# Patient Record
Sex: Female | Born: 1970 | Race: Black or African American | Hispanic: No | Smoking: Current every day smoker
Health system: Southern US, Community
[De-identification: ages and names within clinical notes are randomized; demographics above are authoritative.]

## PROBLEM LIST (undated history)

## (undated) DIAGNOSIS — F329 Major depressive disorder, single episode, unspecified: Secondary | ICD-10-CM

## (undated) DIAGNOSIS — F319 Bipolar disorder, unspecified: Secondary | ICD-10-CM

## (undated) DIAGNOSIS — D573 Sickle-cell trait: Secondary | ICD-10-CM

## (undated) DIAGNOSIS — M1990 Unspecified osteoarthritis, unspecified site: Secondary | ICD-10-CM

## (undated) DIAGNOSIS — J42 Unspecified chronic bronchitis: Secondary | ICD-10-CM

## (undated) DIAGNOSIS — I10 Essential (primary) hypertension: Secondary | ICD-10-CM

## (undated) DIAGNOSIS — D649 Anemia, unspecified: Secondary | ICD-10-CM

## (undated) DIAGNOSIS — F32A Depression, unspecified: Secondary | ICD-10-CM

## (undated) DIAGNOSIS — J40 Bronchitis, not specified as acute or chronic: Secondary | ICD-10-CM

## (undated) HISTORY — DX: Bronchitis, not specified as acute or chronic: J40

## (undated) HISTORY — PX: ABDOMINAL HYSTERECTOMY: SHX81

## (undated) HISTORY — PX: APPENDECTOMY: SHX54

---

## 2008-04-12 ENCOUNTER — Emergency Department: Payer: Self-pay | Admitting: Emergency Medicine

## 2008-11-21 ENCOUNTER — Emergency Department: Payer: Self-pay | Admitting: Emergency Medicine

## 2010-09-01 ENCOUNTER — Emergency Department: Payer: Self-pay | Admitting: Unknown Physician Specialty

## 2014-05-09 DIAGNOSIS — Z6841 Body Mass Index (BMI) 40.0 and over, adult: Secondary | ICD-10-CM | POA: Insufficient documentation

## 2014-05-09 DIAGNOSIS — Z8639 Personal history of other endocrine, nutritional and metabolic disease: Secondary | ICD-10-CM | POA: Insufficient documentation

## 2014-05-09 DIAGNOSIS — Z720 Tobacco use: Secondary | ICD-10-CM | POA: Insufficient documentation

## 2015-11-15 DIAGNOSIS — G8929 Other chronic pain: Secondary | ICD-10-CM | POA: Insufficient documentation

## 2015-11-15 DIAGNOSIS — N920 Excessive and frequent menstruation with regular cycle: Secondary | ICD-10-CM | POA: Insufficient documentation

## 2015-11-15 DIAGNOSIS — M25561 Pain in right knee: Secondary | ICD-10-CM

## 2015-11-15 DIAGNOSIS — M25562 Pain in left knee: Secondary | ICD-10-CM

## 2015-11-15 DIAGNOSIS — D252 Subserosal leiomyoma of uterus: Secondary | ICD-10-CM | POA: Insufficient documentation

## 2015-11-15 DIAGNOSIS — I10 Essential (primary) hypertension: Secondary | ICD-10-CM | POA: Insufficient documentation

## 2015-11-15 DIAGNOSIS — J309 Allergic rhinitis, unspecified: Secondary | ICD-10-CM | POA: Insufficient documentation

## 2015-11-27 DIAGNOSIS — M1712 Unilateral primary osteoarthritis, left knee: Secondary | ICD-10-CM | POA: Insufficient documentation

## 2016-09-11 DIAGNOSIS — Z720 Tobacco use: Secondary | ICD-10-CM | POA: Insufficient documentation

## 2016-09-11 DIAGNOSIS — K439 Ventral hernia without obstruction or gangrene: Secondary | ICD-10-CM | POA: Insufficient documentation

## 2016-11-07 ENCOUNTER — Emergency Department
Admission: EM | Admit: 2016-11-07 | Discharge: 2016-11-07 | Disposition: A | Discharge status: Home / Self Care | Payer: Self-pay | Attending: Emergency Medicine | Admitting: Emergency Medicine

## 2016-11-07 ENCOUNTER — Encounter: Payer: Self-pay | Admitting: Emergency Medicine

## 2016-11-07 ENCOUNTER — Emergency Department: Payer: Self-pay

## 2016-11-07 DIAGNOSIS — I10 Essential (primary) hypertension: Secondary | ICD-10-CM | POA: Insufficient documentation

## 2016-11-07 DIAGNOSIS — M5410 Radiculopathy, site unspecified: Secondary | ICD-10-CM | POA: Insufficient documentation

## 2016-11-07 DIAGNOSIS — F17210 Nicotine dependence, cigarettes, uncomplicated: Secondary | ICD-10-CM | POA: Insufficient documentation

## 2016-11-07 HISTORY — DX: Unspecified osteoarthritis, unspecified site: M19.90

## 2016-11-07 HISTORY — DX: Essential (primary) hypertension: I10

## 2016-11-07 MED ORDER — OXYCODONE-ACETAMINOPHEN 5-325 MG PO TABS
1.0000 | ORAL_TABLET | Freq: Once | ORAL | Status: AC
  Administered 2016-11-07: 1 via ORAL
  Filled 2016-11-07: qty 1

## 2016-11-07 MED ORDER — METHYLPREDNISOLONE SODIUM SUCC 125 MG IJ SOLR
125.0000 mg | Freq: Once | INTRAMUSCULAR | Status: AC
  Administered 2016-11-07: 125 mg via INTRAMUSCULAR
  Filled 2016-11-07: qty 2

## 2016-11-07 MED ORDER — CYCLOBENZAPRINE HCL 5 MG PO TABS: 5.0000 mg | ORAL_TABLET | Freq: Three times a day (TID) | ORAL | 0 refills | Status: AC | PRN

## 2016-11-07 MED ORDER — PREDNISONE 10 MG (21) PO TBPK: ORAL_TABLET | ORAL | 0 refills | Status: DC

## 2016-11-07 NOTE — ED Triage Notes (Signed)
States L shoulder pain x 1 month. Originally gave her pain with movement, now hurts all the time and increases with movement. Denies injury or fall at onset.

## 2016-11-07 NOTE — ED Provider Notes (Signed)
[facility] Emergency Department Provider Note  ____________________________________________  Time seen: Approximately 12:36 PM  I have reviewed the triage vital signs and the nursing notes.   HISTORY  Chief Complaint Shoulder Pain    HPI Dawn Alvarado is a 46 y.o. female that presents to the emergency department with left arm numbness and tingling for one month.Patient states that moving the shoulder makes the pain worse. She states that it is painful when she presses on her shoulder and on her upper back. Cold and wind make the pain worse. Pain is sharp and shooting in character. Her fingers tingle occasionally go numb. No trauma. She has taken Tylenol for pain. She came to the emergency department today because she had trouble sleeping last night due to the pain. She is not diabetic. She denies headache, visual changes, neck pain, shortness of breath, chest pain, palpitations, nausea, vomiting, abdominal pain.   Past Medical History:  Diagnosis Date  . Arthritis   . Hypertension     There are no active problems to display for this patient.   Past Surgical History:  Procedure Laterality Date  . APPENDECTOMY    . CESAREAN SECTION      Prior to Admission medications   Medication Sig Start Date End Date Taking? Authorizing Provider  cyclobenzaprine (FLEXERIL) 5 MG tablet Take 1 tablet (5 mg total) by mouth 3 (three) times daily as needed for muscle spasms. 11/07/16 11/14/16  Laban Emperor, PA-C  predniSONE (STERAPRED UNI-PAK 21 TAB) 10 MG (21) TBPK tablet Take 6 tablets on day 1, take 5 tablets on day 2, take 4 tablets on day 3, take 3 tablets on day 4, take 2 tablets on day 5, take 1 tablet on day 6 11/07/16   Laban Emperor, PA-C    Allergies Patient has no known allergies.  No family history on file.  Social History Social History  Substance Use Topics  . Smoking status: Current Every Day Smoker    Packs/day: 0.50    Types: Cigarettes  .  Smokeless tobacco: Not on file  . Alcohol use Not on file     Review of Systems  Constitutional: No fever/chills ENT: No upper respiratory complaints. Cardiovascular: No chest pain. Respiratory: No SOB. Gastrointestinal: No abdominal pain.  No nausea, no vomiting.  Skin: Negative for rash, abrasions, lacerations, ecchymosis. Neurological: Negative for headaches   ____________________________________________   PHYSICAL EXAM:  VITAL SIGNS: ED Triage Vitals  Enc Vitals Group     BP 11/07/16 1109 (!) 152/98     Pulse Rate 11/07/16 1109 90     Resp 11/07/16 1109 18     Temp 11/07/16 1109 98.3 F (36.8 C)     Temp Source 11/07/16 1109 Oral     SpO2 11/07/16 1109 99 %     Weight 11/07/16 1110 293 lb (132.9 kg)     Height 11/07/16 1110 5\' 10"  (1.778 m)     Head Circumference --      Peak Flow --      Pain Score 11/07/16 1109 10     Pain Loc --      Pain Edu? --      Excl. in [place]? --      Constitutional: Alert and oriented. Well appearing and in no acute distress. Eyes: Conjunctivae are normal. PERRL. EOMI. Head: Atraumatic. ENT:      Ears:      Nose: No congestion/rhinnorhea.      Mouth/Throat: Mucous membranes are moist.  Neck: No  stridor.  No cervical spine tenderness to palpation. No radiculopathy with axial loading. Cardiovascular: Normal rate, regular rhythm.  Good peripheral circulation. Respiratory: Normal respiratory effort without tachypnea or retractions. Lungs CTAB. Good air entry to the bases with no decreased or absent breath sounds. Gastrointestinal: Bowel sounds 4 quadrants. Soft and nontender to palpation. No guarding or rigidity. No palpable masses. No distention. Musculoskeletal: Full range of motion to all extremities. No gross deformities appreciated. Tenderness to palpation over her superior shoulder and over trapezius muscle. Negative Tinel's and Phalens. Neurologic:  Normal speech and language. No gross focal neurologic deficits are appreciated.   Skin:  Skin is warm, dry and intact. No rash noted.  ____________________________________________   LABS (all labs ordered are listed, but only abnormal results are displayed)  Labs Reviewed - No data to display ____________________________________________  EKG   ____________________________________________  RADIOLOGY Robinette Haines, personally viewed and evaluated these images (plain radiographs) as part of my medical decision making, as well as reviewing the written report by the radiologist.  Dg Shoulder Left  Result Date: 11/07/2016 CLINICAL DATA:  Left shoulder pain for 1 month, no known injury, initial encounter EXAM: LEFT SHOULDER - 2+ VIEW COMPARISON:  None. FINDINGS: There is no evidence of fracture or dislocation. There is no evidence of arthropathy or other focal bone abnormality. Soft tissues are unremarkable. IMPRESSION: No acute abnormality noted. Electronically Signed   By: Inez Catalina M.D.   On: 11/07/2016 12:01    ____________________________________________    PROCEDURES  Procedure(s) performed:    Procedures    Medications  oxyCODONE-acetaminophen (PERCOCET/ROXICET) 5-325 MG per tablet 1 tablet (1 tablet Oral Given 11/07/16 1141)  methylPREDNISolone sodium succinate (SOLU-MEDROL) 125 mg/2 mL injection 125 mg (125 mg Intramuscular Given 11/07/16 1242)     ____________________________________________   INITIAL IMPRESSION / ASSESSMENT AND PLAN / ED COURSE  Pertinent labs & imaging results that were available during my care of the patient were reviewed by me and considered in my medical decision making (see chart for details).  Review of the [place] CSRS was performed in accordance of the [place] prior to dispensing any controlled drugs.     Patient's diagnosis is consistent with radiculopathy. Vital signs and exam are reassuring. Shoulder x-ray negative for acute bony abnormalities. No trauma. Patient was given Percocet and Solu-Medrol in ED. Patient  will be discharged home with prescriptions for prednisone and Flexeril. Patient is to follow up with PCP as directed. Patient is given ED precautions to return to the ED for any worsening or new symptoms.     ____________________________________________  FINAL CLINICAL IMPRESSION(S) / ED DIAGNOSES  Final diagnoses:  Radiculopathy, unspecified spinal region      NEW MEDICATIONS STARTED DURING THIS VISIT:  Discharge Medication List as of 11/07/2016 12:49 PM    START taking these medications   Details  cyclobenzaprine (FLEXERIL) 5 MG tablet Take 1 tablet (5 mg total) by mouth 3 (three) times daily as needed for muscle spasms., Starting Fri 11/07/2016, Until Fri 11/14/2016, Print    predniSONE (STERAPRED UNI-PAK 21 TAB) 10 MG (21) TBPK tablet Take 6 tablets on day 1, take 5 tablets on day 2, take 4 tablets on day 3, take 3 tablets on day 4, take 2 tablets on day 5, take 1 tablet on day 6, Print            This chart was dictated using voice recognition software/Dragon. Despite best efforts to proofread, errors can occur which can change the meaning. Any  change was purely unintentional.    Laban Emperor, PA-C 11/07/16 1305    Lisa Roca, MD 11/07/16 1536

## 2017-01-01 DIAGNOSIS — N939 Abnormal uterine and vaginal bleeding, unspecified: Secondary | ICD-10-CM | POA: Insufficient documentation

## 2017-06-01 ENCOUNTER — Encounter: Payer: Self-pay | Admitting: Emergency Medicine

## 2017-06-01 ENCOUNTER — Emergency Department
Admission: EM | Admit: 2017-06-01 | Discharge: 2017-06-01 | Disposition: A | Discharge status: Home / Self Care | Payer: No Typology Code available for payment source | Attending: Emergency Medicine | Admitting: Emergency Medicine

## 2017-06-01 DIAGNOSIS — S8002XA Contusion of left knee, initial encounter: Secondary | ICD-10-CM | POA: Diagnosis not present

## 2017-06-01 DIAGNOSIS — S199XXA Unspecified injury of neck, initial encounter: Secondary | ICD-10-CM | POA: Diagnosis present

## 2017-06-01 DIAGNOSIS — F17210 Nicotine dependence, cigarettes, uncomplicated: Secondary | ICD-10-CM | POA: Diagnosis not present

## 2017-06-01 DIAGNOSIS — Y999 Unspecified external cause status: Secondary | ICD-10-CM | POA: Diagnosis not present

## 2017-06-01 DIAGNOSIS — I10 Essential (primary) hypertension: Secondary | ICD-10-CM | POA: Diagnosis not present

## 2017-06-01 DIAGNOSIS — Y939 Activity, unspecified: Secondary | ICD-10-CM | POA: Insufficient documentation

## 2017-06-01 DIAGNOSIS — Y929 Unspecified place or not applicable: Secondary | ICD-10-CM | POA: Diagnosis not present

## 2017-06-01 DIAGNOSIS — S134XXA Sprain of ligaments of cervical spine, initial encounter: Secondary | ICD-10-CM | POA: Insufficient documentation
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
## 2017-07-31 ENCOUNTER — Emergency Department: Payer: BLUE CROSS/BLUE SHIELD

## 2017-07-31 ENCOUNTER — Encounter: Payer: Self-pay | Admitting: Intensive Care

## 2017-07-31 ENCOUNTER — Emergency Department
Admission: EM | Admit: 2017-07-31 | Discharge: 2017-07-31 | Disposition: A | Discharge status: Home / Self Care | Payer: BLUE CROSS/BLUE SHIELD | Attending: Emergency Medicine | Admitting: Emergency Medicine

## 2017-07-31 DIAGNOSIS — R51 Headache: Secondary | ICD-10-CM | POA: Diagnosis present

## 2017-07-31 DIAGNOSIS — R519 Headache, unspecified: Secondary | ICD-10-CM

## 2017-07-31 DIAGNOSIS — I10 Essential (primary) hypertension: Secondary | ICD-10-CM | POA: Diagnosis not present

## 2017-07-31 DIAGNOSIS — F17210 Nicotine dependence, cigarettes, uncomplicated: Secondary | ICD-10-CM | POA: Insufficient documentation

## 2017-07-31 HISTORY — DX: Sickle-cell trait: D57.3

## 2017-07-31 HISTORY — DX: Major depressive disorder, single episode, unspecified: F32.9

## 2017-07-31 HISTORY — DX: Bipolar disorder, unspecified: F31.9

## 2017-07-31 HISTORY — DX: Unspecified chronic bronchitis: J42

## 2017-07-31 HISTORY — DX: Depression, unspecified: F32.A

## 2017-07-31 HISTORY — DX: Anemia, unspecified: D64.9

## 2017-07-31 LAB — COMPREHENSIVE METABOLIC PANEL
ALBUMIN: 3.9 g/dL (ref 3.5–5.0)
ALK PHOS: 81 U/L (ref 38–126)
ALT: 17 U/L (ref 14–54)
ANION GAP: 3 — AB (ref 5–15)
AST: 24 U/L (ref 15–41)
BUN: 12 mg/dL (ref 6–20)
CO2: 27 mmol/L (ref 22–32)
Calcium: 10 mg/dL (ref 8.9–10.3)
Chloride: 106 mmol/L (ref 101–111)
Creatinine, Ser: 0.71 mg/dL (ref 0.44–1.00)
GFR calc Af Amer: >60 mL/min (ref >60)
GFR calc non Af Amer: >60 mL/min (ref >60)
GLUCOSE: 100 mg/dL — AB (ref 65–99)
POTASSIUM: 4 mmol/L (ref 3.5–5.1)
SODIUM: 136 mmol/L (ref 135–145)
Total Bilirubin: 0.8 mg/dL (ref 0.3–1.2)
Total Protein: 7.2 g/dL (ref 6.5–8.1)

## 2017-07-31 LAB — CBC WITH DIFFERENTIAL/PLATELET
Basophils Absolute: 0.1 10*3/uL (ref 0–0.1)
Basophils Relative: 2 %
Eosinophils Absolute: 0.1 10*3/uL (ref 0–0.7)
Eosinophils Relative: 1 %
HEMATOCRIT: 42 % (ref 35.0–47.0)
HEMOGLOBIN: 14 g/dL (ref 12.0–16.0)
LYMPHS ABS: 2.1 10*3/uL (ref 1.0–3.6)
Lymphocytes Relative: 28 %
MCH: 24.2 pg — AB (ref 26.0–34.0)
MCHC: 33.5 g/dL (ref 32.0–36.0)
MCV: 72.3 fL — ABNORMAL LOW (ref 80.0–100.0)
MONOS PCT: 13 %
Monocytes Absolute: 1 10*3/uL — ABNORMAL HIGH (ref 0.2–0.9)
NEUTROS ABS: 4.3 10*3/uL (ref 1.4–6.5)
NEUTROS PCT: 56 %
Platelets: 205 10*3/uL (ref 150–440)
RBC: 5.81 MIL/uL — ABNORMAL HIGH (ref 3.80–5.20)
RDW: 18.3 % — ABNORMAL HIGH (ref 11.5–14.5)
WBC: 7.6 10*3/uL (ref 3.6–11.0)

## 2017-07-31 LAB — TROPONIN I: Troponin I: <0.03 ng/mL (ref <0.03)

## 2017-07-31 LAB — SEDIMENTATION RATE: Sed Rate: 5 mm/hr (ref 0–20)

## 2017-07-31 MED ORDER — HYDROMORPHONE HCL 1 MG/ML IJ SOLN
0.5000 mg | Freq: Once | INTRAMUSCULAR | Status: AC
  Administered 2017-07-31: 0.5 mg via INTRAVENOUS
  Filled 2017-07-31: qty 1

## 2017-07-31 MED ORDER — ONDANSETRON HCL 4 MG/2ML IJ SOLN
4.0000 mg | Freq: Once | INTRAMUSCULAR | Status: AC
  Administered 2017-07-31: 4 mg via INTRAVENOUS
  Filled 2017-07-31: qty 2

## 2017-07-31 MED ORDER — IOPAMIDOL (ISOVUE-370) INJECTION 76%
75.0000 mL | Freq: Once | INTRAVENOUS | Status: AC | PRN
  Administered 2017-07-31: 75 mL via INTRAVENOUS
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
## 2017-07-31 NOTE — ED Notes (Signed)
Patient transported to CT 
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
## 2017-07-31 NOTE — ED Notes (Signed)
Patient back from CT.

## 2017-07-31 NOTE — ED Notes (Signed)
Patient has clear speech. Strong bilateral hand grips. No facial droop. No numbness to extremities. Reports feeling weak in lower extremities. No problems or deficits with lower extremities and able to put weight without problems

## 2017-07-31 NOTE — Discharge Instructions (Signed)
be sure to stop smoking. Please follow-up with your regular doctor had him review the reports very thing we did today. I discussed your case with Dr. Doy Mince and neurologist. She agrees with the management that I just written. Please return for any worse pain fever vomiting worse weakness or any other problems.

## 2017-08-17 ENCOUNTER — Emergency Department
Admission: EM | Admit: 2017-08-17 | Discharge: 2017-08-17 | Disposition: A | Discharge status: Home / Self Care | Payer: BLUE CROSS/BLUE SHIELD | Attending: Emergency Medicine | Admitting: Emergency Medicine

## 2017-08-17 ENCOUNTER — Encounter: Payer: Self-pay | Admitting: Emergency Medicine

## 2017-08-17 ENCOUNTER — Other Ambulatory Visit: Payer: Self-pay

## 2017-08-17 DIAGNOSIS — F17210 Nicotine dependence, cigarettes, uncomplicated: Secondary | ICD-10-CM | POA: Diagnosis not present

## 2017-08-17 DIAGNOSIS — R040 Epistaxis: Secondary | ICD-10-CM | POA: Diagnosis not present

## 2017-08-17 DIAGNOSIS — I10 Essential (primary) hypertension: Secondary | ICD-10-CM | POA: Insufficient documentation

## 2017-08-17 MED ORDER — OXYMETAZOLINE HCL 0.05 % NA SOLN
1.0000 | Freq: Once | NASAL | Status: AC
  Administered 2017-08-17: 1 via NASAL
  Filled 2017-08-17: qty 15

## 2017-08-17 NOTE — ED Provider Notes (Signed)
[facility] Emergency Department Provider Note  ____________________________________________  Time seen: Approximately 6:16 PM  I have reviewed the triage vital signs and the nursing notes.   HISTORY  Chief Complaint Epistaxis    HPI Dawn Alvarado is a 47 y.o. female resents the emergency department complaining of nosebleed.  Patient reports that she has a history of intermittent nosebleeds but this nosebleed was worse than normal.  Patient reports that she just got over a "cold and sinus infection" and has been blowing her nose, sneezing more than normal.  Patient had been seen by healthcare provider, diagnosed with sinus infection, placed on antibiotics.  Patient reports that the symptoms had improved until today she experienced a nosebleed.  Patient reports that she was bending her nose, leaning her head backwards and then when she released pressure, she was still experiencing a nosebleed.  Patient reports that since she has been in the emergency department with a nose clamp leaning forward, she has not noticed any kind of continued bleeding or postnasal drip.  Patient does have a history of anemia and sickle cell trait but states that recent blood work 2 weeks ago was within normal limits.  No other complaints at this time.  No medications for this complaint prior to arrival.  Nose clip in place.  Past Medical History:  Diagnosis Date  . Anemia   . Arthritis   . Bipolar 1 disorder ([place])   . Chronic bronchitis ([place])   . Depression   . Hypertension   . Sickle cell trait ([place])     There are no active problems to display for this patient.   Past Surgical History:  Procedure Laterality Date  . APPENDECTOMY    . CESAREAN SECTION      Prior to Admission medications   Medication Sig Start Date End Date Taking? Authorizing Provider  predniSONE (STERAPRED UNI-PAK 21 TAB) 10 MG (21) TBPK tablet Take 6 tablets on day 1, take 5 tablets on day 2, take 4 tablets on  day 3, take 3 tablets on day 4, take 2 tablets on day 5, take 1 tablet on day 6 11/07/16   Laban Emperor, PA-C    Allergies Patient has no known allergies.  No family history on file.  Social History Social History   Tobacco Use  . Smoking status: Current Every Day Smoker    Packs/day: 0.50    Types: Cigarettes  . Smokeless tobacco: Never Used  Substance Use Topics  . Alcohol use: No    Frequency: Never  . Drug use: No     Review of Systems  Constitutional: No fever/chills Eyes: No visual changes. No discharge ENT: Positive for nosebleed Cardiovascular: no chest pain. Respiratory: no cough. No SOB. Gastrointestinal: No abdominal pain.  No nausea, no vomiting.   Musculoskeletal: Negative for musculoskeletal pain. Skin: Negative for rash, abrasions, lacerations, ecchymosis. Neurological: Negative for headaches, focal weakness or numbness. 10-point ROS otherwise negative.  ____________________________________________   PHYSICAL EXAM:  VITAL SIGNS: ED Triage Vitals  Enc Vitals Group     BP 08/17/17 1754 (!) 156/100     Pulse Rate 08/17/17 1754 77     Resp 08/17/17 1754 20     Temp 08/17/17 1754 98.2 F (36.8 C)     Temp Source 08/17/17 1754 Oral     SpO2 08/17/17 1754 98 %     Weight 08/17/17 1759 300 lb (136.1 kg)     Height 08/17/17 1759 5\' 9"  (1.753 m)  Head Circumference --      Peak Flow --      Pain Score --      Pain Loc --      Pain Edu? --      Excl. in [place]? --      Constitutional: Alert and oriented. Well appearing and in no acute distress. Eyes: Conjunctivae are normal. PERRL. EOMI. Head: Atraumatic. ENT:      Ears:       Nose: No congestion/rhinnorhea.  No active bleeding after removal of the nose clip.  Patient does have congealed blood in the distal aspect of the bilateral nares.  It appears that the majority of bleeding came from right nares with some crossover to the left nares.  After careful removal of some of the congealed blood, it  appears that bleeding was originating from the Kiesselbach plexus on the right nares.      Mouth/Throat: Mucous membranes are moist.  Neck: No stridor.    Cardiovascular: Normal rate, regular rhythm. Normal S1 and S2.  Good peripheral circulation. Respiratory: Normal respiratory effort without tachypnea or retractions. Lungs CTAB. Good air entry to the bases with no decreased or absent breath sounds. Musculoskeletal: Full range of motion to all extremities. No gross deformities appreciated. Neurologic:  Normal speech and language. No gross focal neurologic deficits are appreciated.  Skin:  Skin is warm, dry and intact. No rash noted. Psychiatric: Mood and affect are normal. Speech and behavior are normal. Patient exhibits appropriate insight and judgement.   ____________________________________________   LABS (all labs ordered are listed, but only abnormal results are displayed)  Labs Reviewed - No data to display ____________________________________________  EKG   ____________________________________________  RADIOLOGY   No results found.  ____________________________________________    PROCEDURES  Procedure(s) performed:    Procedures    Medications  oxymetazoline (AFRIN) 0.05 % nasal spray 1 spray (1 spray Each Nare Given 08/17/17 1852)     ____________________________________________   INITIAL IMPRESSION / ASSESSMENT AND PLAN / ED COURSE  Pertinent labs & imaging results that were available during my care of the patient were reviewed by me and considered in my medical decision making (see chart for details).  Review of the [place] CSRS was performed in accordance of the [place] prior to dispensing any controlled drugs.  Clinical Course as of Aug 17 1924  Mon Aug 17, 2017  2025 Patient presented with nosebleed.  She has a history of same but states this was worse than normal.  Patient just finished course of antibiotics for sinusitis.  At this time, exam is  reassuring with good sensation with nose clip.  Nose clip is to remain in place and Afrin administered.  Patient continues to be free of nosebleed, she will be discharged.  Initial differential included posterior nasal bleed or anterior nosebleed.  With worsening of nosebleed, differential included thrombocytopenia, trauma, recent infection.  Patient recently had sinus infection with increased nasal congestion, sneezing.  I feel that this is contributed to the bleeding from the Kiesselbach plexus.  [JC]  [id number] I discussed course the patient, she is agreeable to trying nose clip and Afrin.  If symptoms resolve patient is to be discharged.  I offered labs to evaluate blood work, however patient's recent labs were all within normal limits and she declines at this time.  I feel this is reasonable.  [JC]    Clinical Course User Index [JC] Cuthriell, Charline Bills, PA-C    Patient's diagnosis is consistent with epistaxis.  Patient presented with her history of nosebleed.  History of same in the past.  Patient has just recently gotten over  sinus infection with increased nasal congestion, increased nasal drainage.  Nosebleed was well contained with nasal clamp and Afrin.  No return of bleeding.  After clamp and Afrin, patient was observed for another 45 minutes with no return of bleeding.  No prescriptions at this time.  Patient will follow up with ENT as needed.   Patient is given ED precautions to return to the ED for any worsening or new symptoms.     ____________________________________________  FINAL CLINICAL IMPRESSION(S) / ED DIAGNOSES  Final diagnoses:  Epistaxis      NEW MEDICATIONS STARTED DURING THIS VISIT:  ED Discharge Orders    None          This chart was dictated using voice recognition software/Dragon. Despite best efforts to proofread, errors can occur which can change the meaning. Any change was purely unintentional.    Darletta Moll, PA-C 08/17/17 1926     Schuyler Amor, MD 08/17/17 2340

## 2017-08-17 NOTE — ED Triage Notes (Signed)
Nosebleed x 1 hour. States has nosebleeds at times but this is more than usual.

## 2017-12-25 ENCOUNTER — Encounter: Payer: Self-pay | Admitting: Emergency Medicine

## 2017-12-25 ENCOUNTER — Emergency Department
Admission: EM | Admit: 2017-12-25 | Discharge: 2017-12-25 | Disposition: A | Discharge status: Home / Self Care | Payer: BLUE CROSS/BLUE SHIELD | Attending: Emergency Medicine | Admitting: Emergency Medicine

## 2017-12-25 DIAGNOSIS — F17210 Nicotine dependence, cigarettes, uncomplicated: Secondary | ICD-10-CM | POA: Insufficient documentation

## 2017-12-25 DIAGNOSIS — I10 Essential (primary) hypertension: Secondary | ICD-10-CM | POA: Diagnosis not present

## 2017-12-25 DIAGNOSIS — M545 Low back pain: Secondary | ICD-10-CM | POA: Diagnosis present

## 2017-12-25 DIAGNOSIS — Z79899 Other long term (current) drug therapy: Secondary | ICD-10-CM | POA: Diagnosis not present

## 2017-12-25 DIAGNOSIS — N10 Acute tubulo-interstitial nephritis: Secondary | ICD-10-CM | POA: Diagnosis not present

## 2017-12-25 LAB — URINALYSIS, COMPLETE (UACMP) WITH MICROSCOPIC
Bilirubin Urine: NEGATIVE
Glucose, UA: NEGATIVE mg/dL
Hgb urine dipstick: NEGATIVE
Ketones, ur: NEGATIVE mg/dL
Leukocytes, UA: NEGATIVE
Nitrite: POSITIVE — AB
Protein, ur: NEGATIVE mg/dL
SPECIFIC GRAVITY, URINE: 1.016 (ref 1.005–1.030)
pH: 6 (ref 5.0–8.0)

## 2017-12-25 MED ORDER — CEFTRIAXONE SODIUM 1 G IJ SOLR
1.0000 g | Freq: Once | INTRAMUSCULAR | Status: AC
  Administered 2017-12-25: 1 g via INTRAMUSCULAR
  Filled 2017-12-25: qty 10

## 2017-12-25 MED ORDER — LIDOCAINE HCL (PF) 1 % IJ SOLN
1.2000 mL | Freq: Once | INTRAMUSCULAR | Status: AC
  Administered 2017-12-25: 1.2 mL
  Filled 2017-12-25: qty 5

## 2017-12-25 MED ORDER — OXYCODONE HCL 5 MG PO TABS
5.0000 mg | ORAL_TABLET | Freq: Once | ORAL | Status: AC
  Administered 2017-12-25: 5 mg via ORAL
  Filled 2017-12-25: qty 1

## 2017-12-25 MED ORDER — NAPROXEN 500 MG PO TABS: 500.0000 mg | ORAL_TABLET | Freq: Two times a day (BID) | ORAL | 0 refills | Status: DC

## 2017-12-25 MED ORDER — LEVOFLOXACIN 750 MG PO TABS: 750.0000 mg | ORAL_TABLET | Freq: Every day | ORAL | 0 refills | Status: AC

## 2017-12-25 MED ORDER — TRAMADOL HCL 50 MG PO TABS: 50.0000 mg | ORAL_TABLET | Freq: Four times a day (QID) | ORAL | 0 refills | Status: DC | PRN

## 2017-12-25 NOTE — ED Notes (Signed)
Pt arrived via POV with c/o back pain. Pt states that she has been having lower back pain that radiates the upper back for a week. Pt states that over the week, the pain has progressively gotten worse. Pt states that today she was barely able to walk to the car this morning. Pt states that she has been slightly nauseated, but no V/D.

## 2017-12-25 NOTE — ED Triage Notes (Signed)
Pt reports lower back pain for a week and urinary frequency. Pt states the pain gets worse everyday. Pt reports all the way across her back.

## 2017-12-25 NOTE — ED Provider Notes (Signed)
[facility] Emergency Department Provider Note  ____________________________________________  Time seen: Approximately 3:42 PM  I have reviewed the triage vital signs and the nursing notes.   HISTORY  Chief Complaint Back Pain and Urinary Frequency    HPI Dawn Alvarado is a 47 y.o. female who presents to the emergency department for evaluation and treatment of low back pain that started about a week ago. Initially, pain was only on the right side but has since moved to the left as well. 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 No specific injury. She reports urinary frequency, but also states that she had been off of her HCTZ for 2 weeks and restarted it this week. She denies vaginal discharge. No known fever. No abdominal pain. No nausea or vomiting.  Past Medical History:  Diagnosis Date  . Anemia   . Arthritis   . Bipolar 1 disorder ([place])   . Chronic bronchitis ([place])   . Depression   . Hypertension   . Sickle cell trait ([place])     There are no active problems to display for this patient.   Past Surgical History:  Procedure Laterality Date  . APPENDECTOMY    . CESAREAN SECTION      Prior to Admission medications   Medication Sig Start Date End Date Taking? Authorizing Provider  levofloxacin (LEVAQUIN) 750 MG tablet Take 1 tablet (750 mg total) by mouth daily for 7 days. 12/25/17 01/01/18  Jenifer Struve, Johnette Abraham B, FNP  naproxen (NAPROSYN) 500 MG tablet Take 1 tablet (500 mg total) by mouth 2 (two) times daily with a meal. 12/25/17   Kingsten Enfield B, FNP  predniSONE (STERAPRED UNI-PAK 21 TAB) 10 MG (21) TBPK tablet Take 6 tablets on day 1, take 5 tablets on day 2, take 4 tablets on day 3, take 3 tablets on day 4, take 2 tablets on day 5, take 1 tablet on day 6 11/07/16   Laban Emperor, PA-C  traMADol (ULTRAM) 50 MG tablet Take 1 tablet (50 mg total) by mouth every 6 (six) hours as needed. 12/25/17   Victorino Dike, FNP     Allergies Patient has no known allergies.  No family history on file.  Social History Social History   Tobacco Use  . Smoking status: Current Every Day Smoker    Packs/day: 0.50    Types: Cigarettes  . Smokeless tobacco: Never Used  Substance Use Topics  . Alcohol use: No    Frequency: Never  . Drug use: No    Review of Systems Constitutional: Negative for fever. Respiratory: Negative for shortness of breath or cough. Gastrointestinal: Negative for abdominal pain; negative for nausea , negative for vomiting. Genitourinary: Negative for dysuria , Negative for vaginal discharge. Positive for urinary frequenct. Musculoskeletal: Positive for back pain. Skin: Negative for open wound or lesion. ____________________________________________   PHYSICAL EXAM:  VITAL SIGNS: ED Triage Vitals  Enc Vitals Group     BP 12/25/17 1507 (!) 139/94     Pulse Rate 12/25/17 1507 90     Resp 12/25/17 1507 20     Temp 12/25/17 1507 98.1 F (36.7 C)     Temp Source 12/25/17 1507 Oral     SpO2 12/25/17 1507 96 %     Weight 12/25/17 1511 (!) 302 lb (137 kg)     Height 12/25/17 1511 5\' 11"  (1.803 m)     Head Circumference --      Peak Flow --  Pain Score 12/25/17 1508 9     Pain Loc --      Pain Edu? --      Excl. in [place]? --     Constitutional: Alert and oriented. Well appearing and in no acute distress. Eyes: Conjunctivae are normal. PERRL. EOMI. Head: Atraumatic. Nose: No congestion/rhinnorhea. Mouth/Throat: Mucous membranes are moist. Respiratory: Normal respiratory effort.  No retractions. Gastrointestinal: Abdomen is soft, nontender, no rebound or guarding.  Genitourinary: Pelvic exam: not indicated. Musculoskeletal: No extremity tenderness nor edema. CVA tenderness bilaterally. Neurologic:  Normal speech and language. No gross focal neurologic deficits are appreciated. Speech is normal. No gait instability. Skin:  Skin is warm, dry and intact. No rash  noted. Psychiatric: Mood and affect are normal. Speech and behavior are normal.  ____________________________________________   LABS (all labs ordered are listed, but only abnormal results are displayed)  Labs Reviewed  URINALYSIS, COMPLETE (UACMP) WITH MICROSCOPIC - Abnormal; Notable for the following components:      Result Value   Color, Urine YELLOW (*)    APPearance HAZY (*)    Nitrite POSITIVE (*)    Bacteria, UA MANY (*)    All other components within normal limits   ____________________________________________  RADIOLOGY  Not indicated. ____________________________________________   PROCEDURES  Procedure(s) performed: None  ____________________________________________  47 year old female who presents to the emergency department for treatment and evaluation of bilateral flank pain. Urinalysis, symptoms, and exam consistent with pyelonephritis. She was given 1g of Rocephin while in the ER and will take a 7 day course of Levaquin. Urine culture has been requested. She is to follow up with her PCP in 7-10 days for a recheck or sooner for symptoms of concern. She is to return to the ER for fever or increase in severity of pain.   INITIAL IMPRESSION / ASSESSMENT AND PLAN / ED COURSE  Pertinent labs & imaging results that were available during my care of the patient were reviewed by me and considered in my medical decision making (see chart for details).  ____________________________________________   FINAL CLINICAL IMPRESSION(S) / ED DIAGNOSES  Final diagnoses:  Pyelonephritis, acute    Note:  This document was prepared using Dragon voice recognition software and may include unintentional dictation errors.    Victorino Dike, FNP 12/25/17 1631    Harvest Dark, MD 12/25/17 2248

## 2017-12-25 NOTE — Discharge Instructions (Signed)
If you develop a fever, return to the ER.  Follow up with your primary care provider in 7-10 days or sooner for symptoms of concern.

## 2017-12-27 LAB — URINE CULTURE: Culture: >=100000 — AB

## 2018-01-18 ENCOUNTER — Emergency Department: Payer: BLUE CROSS/BLUE SHIELD

## 2018-01-18 ENCOUNTER — Emergency Department
Admission: EM | Admit: 2018-01-18 | Discharge: 2018-01-18 | Disposition: A | Discharge status: Home / Self Care | Payer: BLUE CROSS/BLUE SHIELD | Attending: Student in an Organized Health Care Education/Training Program | Admitting: Student in an Organized Health Care Education/Training Program

## 2018-01-18 ENCOUNTER — Other Ambulatory Visit: Payer: Self-pay

## 2018-01-18 ENCOUNTER — Encounter: Payer: Self-pay | Admitting: Emergency Medicine

## 2018-01-18 DIAGNOSIS — I10 Essential (primary) hypertension: Secondary | ICD-10-CM | POA: Diagnosis not present

## 2018-01-18 DIAGNOSIS — Z79899 Other long term (current) drug therapy: Secondary | ICD-10-CM | POA: Insufficient documentation

## 2018-01-18 DIAGNOSIS — M1711 Unilateral primary osteoarthritis, right knee: Secondary | ICD-10-CM | POA: Insufficient documentation

## 2018-01-18 DIAGNOSIS — Z760 Encounter for issue of repeat prescription: Secondary | ICD-10-CM | POA: Diagnosis not present

## 2018-01-18 DIAGNOSIS — F17210 Nicotine dependence, cigarettes, uncomplicated: Secondary | ICD-10-CM | POA: Diagnosis not present

## 2018-01-18 DIAGNOSIS — M25561 Pain in right knee: Secondary | ICD-10-CM | POA: Diagnosis present

## 2018-01-18 MED ORDER — MELOXICAM 15 MG PO TABS: 15.0000 mg | ORAL_TABLET | Freq: Every day | ORAL | 0 refills | Status: DC

## 2018-01-18 MED ORDER — TRAMADOL HCL 50 MG PO TABS: 50.0000 mg | ORAL_TABLET | Freq: Four times a day (QID) | ORAL | 0 refills | Status: DC | PRN

## 2018-01-18 MED ORDER — HYDROCHLOROTHIAZIDE 25 MG PO TABS: 25.0000 mg | ORAL_TABLET | Freq: Every day | ORAL | 0 refills | Status: DC

## 2018-01-18 NOTE — ED Notes (Signed)
See triage note  States she is out of her b/p meds and fluid pills .Marland Kitchen Now having swelling to legs but also having pain to knee hx of arthritis  Denies any  recent  injury

## 2018-01-18 NOTE — ED Triage Notes (Addendum)
R knee pain x 3 days, denies injury, states history of arthritis in that knee. Has been out of blood pressure medication x 2 weeks, unable to get appointment with PMD. States "I think I have fluid on my knee"

## 2018-01-18 NOTE — ED Provider Notes (Signed)
[facility] Emergency Department Provider Note ____________________________________________  Time seen: Approximately 3:13 PM  I have reviewed the triage vital signs and the nursing notes.   HISTORY  Chief Complaint Knee Pain    HPI Dawn Alvarado is a 47 y.o. female who presents to the emergency department for evaluation and treatment of right knee pain x 3 days without injury. She reports a history of arthritis in the knee. No relief with tramadol or ibuprofen. She also states that her PCP stopped taking her insurance and she can't get an appointment for a HCTZ refill unless she pays cash for the visit and she can't afford it.   Past Medical History:  Diagnosis Date  . Anemia   . Arthritis   . Bipolar 1 disorder ([place])   . Chronic bronchitis ([place])   . Depression   . Hypertension   . Sickle cell trait ([place])     There are no active problems to display for this patient.   Past Surgical History:  Procedure Laterality Date  . APPENDECTOMY    . CESAREAN SECTION      Prior to Admission medications   Medication Sig Start Date End Date Taking? Authorizing Provider  hydrochlorothiazide (HYDRODIURIL) 25 MG tablet Take 1 tablet (25 mg total) by mouth daily. 01/18/18   Shaely Gadberry, Johnette Abraham B, FNP  meloxicam (MOBIC) 15 MG tablet Take 1 tablet (15 mg total) by mouth daily. 01/18/18   Tonique Mendonca B, FNP  predniSONE (STERAPRED UNI-PAK 21 TAB) 10 MG (21) TBPK tablet Take 6 tablets on day 1, take 5 tablets on day 2, take 4 tablets on day 3, take 3 tablets on day 4, take 2 tablets on day 5, take 1 tablet on day 6 11/07/16   Laban Emperor, PA-C  traMADol (ULTRAM) 50 MG tablet Take 1 tablet (50 mg total) by mouth every 6 (six) hours as needed. 01/18/18   Victorino Dike, FNP    Allergies Patient has no known allergies.  No family history on file.  Social History Social History   Tobacco Use  . Smoking status: Current Every Day Smoker    Packs/day: 0.50    Types:  Cigarettes  . Smokeless tobacco: Never Used  Substance Use Topics  . Alcohol use: No    Frequency: Never  . Drug use: No    Review of Systems Constitutional: Negative for fever. Cardiovascular: Negative for chest pain. Respiratory: Negative for shortness of breath. Musculoskeletal: Positive for right knee pain. Skin: Negative for open wound or lesion, positive for swelling of the right knee.  Neurological: Negative for decrease in sensation  ____________________________________________   PHYSICAL EXAM:  VITAL SIGNS: ED Triage Vitals  Enc Vitals Group     BP 01/18/18 1421 (!) 165/90     Pulse Rate 01/18/18 1421 90     Resp 01/18/18 1421 18     Temp 01/18/18 1421 98 F (36.7 C)     Temp Source 01/18/18 1421 Oral     SpO2 01/18/18 1421 99 %     Weight 01/18/18 1422 (!) 302 lb (137 kg)     Height 01/18/18 1422 5\' 10"  (1.778 m)     Head Circumference --      Peak Flow --      Pain Score 01/18/18 1422 10     Pain Loc --      Pain Edu? --      Excl. in [place]? --     Constitutional: Alert and oriented. Well appearing  and in no acute distress. Eyes: Conjunctivae are clear without discharge or drainage Head: Atraumatic Neck: Supple Respiratory: No cough. Respirations are even and unlabored. Musculoskeletal: Limited flexion of the right knee secondary to pain. Full extension at rest. Ligament exam limited due to expression of pain with any movement. Neurologic: Awake, alert, and oriented x 4.  Skin: Intact. No pitting edema.  Psychiatric: Affect and behavior are appropriate.  ____________________________________________   LABS (all labs ordered are listed, but only abnormal results are displayed)  Labs Reviewed - No data to display ____________________________________________  RADIOLOGY  X-ray of the right knee shows osteoarthritis and a small joint  effusion. ____________________________________________   PROCEDURES  Procedures  ____________________________________________   INITIAL IMPRESSION / ASSESSMENT AND PLAN / ED COURSE  Artie Takayama is a 47 y.o. who presents to the emergency department for treatment and evaluation of right knee pain and medication refill.   She will be treated with HCTZ, Naprosyn and tramadol and instructed to follow-up with her primary care provider and orthopedics.  She was also instructed to return to the emergency department for symptoms that change or worsen if unable schedule an appointment with orthopedics or primary care.  Medications - No data to display  Pertinent labs & imaging results that were available during my care of the patient were reviewed by me and considered in my medical decision making (see chart for details).  _________________________________________   FINAL CLINICAL IMPRESSION(S) / ED DIAGNOSES  Final diagnoses:  Osteoarthritis of right knee, unspecified osteoarthritis type  Encounter for medication refill    ED Discharge Orders        Ordered    traMADol (ULTRAM) 50 MG tablet  Every 6 hours PRN     01/18/18 1628    meloxicam (MOBIC) 15 MG tablet  Daily     01/18/18 1628    hydrochlorothiazide (HYDRODIURIL) 25 MG tablet  Daily     01/18/18 1628       If controlled substance prescribed during this visit, 12 month history viewed on the [place] prior to issuing an initial prescription for Schedule II or III opiod.    Victorino Dike, FNP 01/18/18 2121    Merlyn Lot, MD 01/18/18 2221

## 2018-06-14 ENCOUNTER — Other Ambulatory Visit: Payer: Self-pay

## 2018-06-14 ENCOUNTER — Emergency Department: Payer: BLUE CROSS/BLUE SHIELD

## 2018-06-14 ENCOUNTER — Emergency Department
Admission: EM | Admit: 2018-06-14 | Discharge: 2018-06-14 | Disposition: A | Discharge status: Home / Self Care | Payer: BLUE CROSS/BLUE SHIELD | Attending: Emergency Medicine | Admitting: Emergency Medicine

## 2018-06-14 ENCOUNTER — Encounter: Payer: Self-pay | Admitting: Emergency Medicine

## 2018-06-14 DIAGNOSIS — R1031 Right lower quadrant pain: Secondary | ICD-10-CM | POA: Insufficient documentation

## 2018-06-14 DIAGNOSIS — F17210 Nicotine dependence, cigarettes, uncomplicated: Secondary | ICD-10-CM | POA: Insufficient documentation

## 2018-06-14 DIAGNOSIS — R1030 Lower abdominal pain, unspecified: Secondary | ICD-10-CM | POA: Diagnosis not present

## 2018-06-14 DIAGNOSIS — I10 Essential (primary) hypertension: Secondary | ICD-10-CM | POA: Diagnosis not present

## 2018-06-14 DIAGNOSIS — N83201 Unspecified ovarian cyst, right side: Secondary | ICD-10-CM | POA: Diagnosis not present

## 2018-06-14 DIAGNOSIS — R110 Nausea: Secondary | ICD-10-CM | POA: Insufficient documentation

## 2018-06-14 DIAGNOSIS — Z79899 Other long term (current) drug therapy: Secondary | ICD-10-CM | POA: Diagnosis not present

## 2018-06-14 LAB — CBC
HEMATOCRIT: 41.8 % (ref 36.0–46.0)
Hemoglobin: 14.6 g/dL (ref 12.0–15.0)
MCH: 26.4 pg (ref 26.0–34.0)
MCHC: 34.9 g/dL (ref 30.0–36.0)
MCV: 75.6 fL — AB (ref 80.0–100.0)
NRBC: 0 % (ref 0.0–0.2)
Platelets: 218 10*3/uL (ref 150–400)
RBC: 5.53 MIL/uL — ABNORMAL HIGH (ref 3.87–5.11)
RDW: 14 % (ref 11.5–15.5)
WBC: 8 10*3/uL (ref 4.0–10.5)

## 2018-06-14 LAB — URINALYSIS, COMPLETE (UACMP) WITH MICROSCOPIC
BACTERIA UA: NONE SEEN
Bilirubin Urine: NEGATIVE
Glucose, UA: NEGATIVE mg/dL
Hgb urine dipstick: NEGATIVE
KETONES UR: NEGATIVE mg/dL
LEUKOCYTES UA: NEGATIVE
Nitrite: NEGATIVE
Protein, ur: NEGATIVE mg/dL
Specific Gravity, Urine: 1.017 (ref 1.005–1.030)
pH: 6 (ref 5.0–8.0)

## 2018-06-14 LAB — LIPASE, BLOOD: LIPASE: 31 U/L (ref 11–51)

## 2018-06-14 LAB — COMPREHENSIVE METABOLIC PANEL
ALT: 20 U/L (ref 0–44)
ANION GAP: 4 — AB (ref 5–15)
AST: 19 U/L (ref 15–41)
Albumin: 3.9 g/dL (ref 3.5–5.0)
Alkaline Phosphatase: 89 U/L (ref 38–126)
BILIRUBIN TOTAL: 0.7 mg/dL (ref 0.3–1.2)
BUN: 11 mg/dL (ref 6–20)
CHLORIDE: 108 mmol/L (ref 98–111)
CO2: 25 mmol/L (ref 22–32)
CREATININE: 0.63 mg/dL (ref 0.44–1.00)
Calcium: 9.8 mg/dL (ref 8.9–10.3)
GFR calc Af Amer: >60 mL/min (ref >60)
GLUCOSE: 96 mg/dL (ref 70–99)
Potassium: 4.1 mmol/L (ref 3.5–5.1)
Sodium: 137 mmol/L (ref 135–145)
Total Protein: 7.1 g/dL (ref 6.5–8.1)

## 2018-06-14 MED ORDER — ONDANSETRON HCL 4 MG/2ML IJ SOLN
4.0000 mg | Freq: Once | INTRAMUSCULAR | Status: AC
  Administered 2018-06-14: 4 mg via INTRAVENOUS
  Filled 2018-06-14: qty 2

## 2018-06-14 MED ORDER — IOHEXOL 300 MG/ML  SOLN
30.0000 mL | Freq: Once | INTRAMUSCULAR | Status: AC | PRN
  Administered 2018-06-14: 30 mL via ORAL

## 2018-06-14 MED ORDER — FENTANYL CITRATE (PF) 100 MCG/2ML IJ SOLN
100.0000 ug | Freq: Once | INTRAMUSCULAR | Status: AC
  Administered 2018-06-14: 100 ug via INTRAVENOUS
  Filled 2018-06-14: qty 2

## 2018-06-14 MED ORDER — OXYCODONE-ACETAMINOPHEN 5-325 MG PO TABS: 1.0000 | ORAL_TABLET | ORAL | 0 refills | Status: DC | PRN

## 2018-06-14 MED ORDER — IOPAMIDOL (ISOVUE-300) INJECTION 61%
125.0000 mL | Freq: Once | INTRAVENOUS | Status: AC | PRN
  Administered 2018-06-14: 125 mL via INTRAVENOUS

## 2018-06-14 MED ORDER — SODIUM CHLORIDE 0.9 % IV BOLUS
1000.0000 mL | Freq: Once | INTRAVENOUS | Status: AC
  Administered 2018-06-14: 1000 mL via INTRAVENOUS

## 2018-06-14 NOTE — ED Notes (Signed)
ssays low abd pain continues and is worse when she coughs.

## 2018-06-14 NOTE — ED Notes (Signed)
Pt verbalized discharge instructions and has no questions at this time 

## 2018-06-14 NOTE — ED Notes (Signed)
Patient transported to CT 

## 2018-06-14 NOTE — ED Triage Notes (Signed)
Pt arrived via POV with reports of lower abdominal pain that started about 2 days ago, pt reports frequent urination as well.  Pt denies any odor to urine.  Pt describes the pain as tender and tight. Pt reports she has abdominal pain when coughing.

## 2018-06-14 NOTE — ED Provider Notes (Addendum)
[facility] Emergency Department Provider Note  Time seen: 1:02 PM  I have reviewed the triage vital signs and the nursing notes.   HISTORY  Chief Complaint Abdominal Pain    HPI Dawn Alvarado is a 47 y.o. female with a past medical history of bipolar, depression, hypertension, anemia, arthritis, presents to the emergency department for lower abdominal pain.  According to the patient over the past 2 days she has been experiencing progressive lower abdominal pain especially in the right lower quadrant.  Denies any fever.  States intermittent nausea but denies any vomiting today.  Denies any diarrhea but does state moderate constipation.  Describes the pain as a dull aching pain across lower abdomen but mostly in the right lower quadrant.   Past Medical History:  Diagnosis Date  . Anemia   . Arthritis   . Bipolar 1 disorder ([place])   . Chronic bronchitis ([place])   . Depression   . Hypertension   . Sickle cell trait ([place])     There are no active problems to display for this patient.   Past Surgical History:  Procedure Laterality Date  . ABDOMINAL HYSTERECTOMY    . APPENDECTOMY    . CESAREAN SECTION      Prior to Admission medications   Medication Sig Start Date End Date Taking? Authorizing Provider  hydrochlorothiazide (HYDRODIURIL) 25 MG tablet Take 1 tablet (25 mg total) by mouth daily. 01/18/18   Triplett, Johnette Abraham B, FNP  meloxicam (MOBIC) 15 MG tablet Take 1 tablet (15 mg total) by mouth daily. 01/18/18   Triplett, Cari B, FNP  predniSONE (STERAPRED UNI-PAK 21 TAB) 10 MG (21) TBPK tablet Take 6 tablets on day 1, take 5 tablets on day 2, take 4 tablets on day 3, take 3 tablets on day 4, take 2 tablets on day 5, take 1 tablet on day 6 11/07/16   Laban Emperor, PA-C  traMADol (ULTRAM) 50 MG tablet Take 1 tablet (50 mg total) by mouth every 6 (six) hours as needed. 01/18/18   Victorino Dike, FNP    Allergies  Allergen Reactions  . Morphine Itching     History reviewed. No pertinent family history.  Social History Social History   Tobacco Use  . Smoking status: Current Every Day Smoker    Packs/day: 0.50    Types: Cigarettes  . Smokeless tobacco: Never Used  Substance Use Topics  . Alcohol use: No    Frequency: Never  . Drug use: No    Review of Systems Constitutional: Negative for fever. Cardiovascular: Negative for chest pain. Respiratory: Negative for shortness of breath. Gastrointestinal: Lower abdominal pain x2 days.  Positive for nausea.  Mild constipation. Genitourinary: Positive for urinary frequency but negative for dysuria. Musculoskeletal: Negative for musculoskeletal complaints Skin: Negative for skin complaints  Neurological: Negative for headache All other ROS negative  ____________________________________________   PHYSICAL EXAM:  VITAL SIGNS: ED Triage Vitals  Enc Vitals Group     BP 06/14/18 0919 (!) 139/102     Pulse Rate 06/14/18 0919 81     Resp 06/14/18 0919 20     Temp 06/14/18 0919 98.2 F (36.8 C)     Temp Source 06/14/18 0919 Oral     SpO2 06/14/18 0919 100 %     Weight 06/14/18 0917 (!) 304 lb (137.9 kg)     Height 06/14/18 0917 5\' 11"  (1.803 m)     Head Circumference --      Peak Flow --  Pain Score 06/14/18 0916 9     Pain Loc --      Pain Edu? --      Excl. in [place]? --    Constitutional: Alert and oriented. Well appearing and in no distress. Eyes: Normal exam ENT   Head: Normocephalic and atraumatic.   Mouth/Throat: Mucous membranes are moist. Cardiovascular: Normal rate, regular rhythm. No murmur Respiratory: Normal respiratory effort without tachypnea nor retractions. Breath sounds are clear  Gastrointestinal: Soft, moderate lower abdominal tenderness palpation, no rebound guarding or distention. Musculoskeletal: Nontender with normal range of motion in all extremities. Neurologic:  Normal speech and language. No gross focal neurologic deficits are  appreciated. Skin:  Skin is warm, dry and intact.  Psychiatric: Mood and affect are normal. Speech and behavior are normal.   ____________________________________________   RADIOLOGY  CT shows right left-sided ovarian cyst, pelvic ultrasound pending.  ____________________________________________   INITIAL IMPRESSION / ASSESSMENT AND PLAN / ED COURSE  Pertinent labs & imaging results that were available during my care of the patient were reviewed by me and considered in my medical decision making (see chart for details).  Patient presents to the emergency department for lower abdominal pain x2 days.  Differential would include colitis, diverticulitis, urinary tract infection, stump appendicitis, patient is status post appendectomy per patient.  Patient's labs including urine are largely within normal limits.  Given her moderate tenderness to palpation we will obtain CT imaging to further evaluate.  Patient agreeable to plan of care we will dose pain and nausea medication while awaiting CT results.  CT shows a large right ovarian cyst we will proceed with pelvic ultrasound to further evaluate.  Pelvic ultrasound pending, patient care signed out to oncoming physician.  Possible endometrioma versus cyst on right ovary.  We will have patient follow-up with OB/GYN.  Will require a repeat ultrasound at 6 weeks to ensure resolution.  We will discharge with a short course of pain medication.  Patient agreeable to plan of care.  ____________________________________________   FINAL CLINICAL IMPRESSION(S) / ED DIAGNOSES  Lower abdominal pain Ovarian cyst   Harvest Dark, MD 06/14/18 1540    Harvest Dark, MD 06/14/18 1609

## 2018-06-14 NOTE — ED Notes (Signed)
Pt back from US

## 2018-06-14 NOTE — ED Notes (Signed)
Patient transported to Ultrasound 

## 2018-06-21 ENCOUNTER — Encounter: Payer: Self-pay | Admitting: Obstetrics and Gynecology

## 2018-06-21 ENCOUNTER — Ambulatory Visit (INDEPENDENT_AMBULATORY_CARE_PROVIDER_SITE_OTHER): Payer: BLUE CROSS/BLUE SHIELD | Admitting: Obstetrics and Gynecology

## 2018-06-21 VITALS — BP 110/70 | HR 80 | Wt 311.0 lb

## 2018-06-21 DIAGNOSIS — R1031 Right lower quadrant pain: Secondary | ICD-10-CM | POA: Diagnosis not present

## 2018-06-21 DIAGNOSIS — K429 Umbilical hernia without obstruction or gangrene: Secondary | ICD-10-CM

## 2018-06-21 DIAGNOSIS — N838 Other noninflammatory disorders of ovary, fallopian tube and broad ligament: Secondary | ICD-10-CM

## 2018-06-21 DIAGNOSIS — Z0000 Encounter for general adult medical examination without abnormal findings: Secondary | ICD-10-CM

## 2018-06-21 MED ORDER — HYDROCHLOROTHIAZIDE 25 MG PO TABS: 25.0000 mg | ORAL_TABLET | Freq: Every day | ORAL | 2 refills | Status: AC

## 2018-06-21 MED ORDER — AMLODIPINE BESYLATE 5 MG PO TABS: 5.0000 mg | ORAL_TABLET | Freq: Every day | ORAL | 3 refills | Status: AC

## 2018-06-21 NOTE — Progress Notes (Addendum)
Obstetrics & Gynecology Office Visit   Chief Complaint:  Chief Complaint  Patient presents with  . er follow up    History of Present Illness: The patient is a 47 y.o. female presenting for emergency room follow up concerning a recently imaged right adnexal cyst.  Initial presentation was prompted by right lower quadrant.  Previous CT imaging demonstrated a thick-walled hypodense mass in the right adnexa measuring 5.3 x 5.6 by 6.4 cm.  Appearance was notable simple cyst, no free fluid, no lymphadenopathy, no omental caking and absence of ascites. The patient endorses associated symptoms of abdominal pain.  The patient denies associated symptoms of  night sweats, vaginal bleeding, pelvic pressure, constipation, diarrhea, nausea and emesis.  There is not a notable family history of ovarian cancer, uterine cancer, breast cancer, or colon cancer.  Ultrasound revealed a 5.8 x 5.4 x 4.6cm hypoechoic mass/cyst with posterior acoustic enhancement noted without internal vascular flow.  The patient is s/p TAH/BS/cystoscopy/ventral hernia repair (primary no mesh) 01/01/2017.  1849mL EBL and 4U of pRBC.  Dense adhesive disease noted on operative note.  No mention of endometriosis at that time.    Review of Systems: 10 point review of systems negative unless otherwise noted in HPI  Past Medical History:  Past Medical History:  Diagnosis Date  . Anemia   . Arthritis   . Bipolar 1 disorder ([place])   . Chronic bronchitis ([place])   . Depression   . Hypertension   . Sickle cell trait [facility])     Past Surgical History:  Past Surgical History:  Procedure Laterality Date  . ABDOMINAL HYSTERECTOMY    . APPENDECTOMY    . CESAREAN SECTION      Gynecologic History: Patient's last menstrual period was 10/31/2016.  Obstetric History: No obstetric history on file.  Family History:  Family History  Problem Relation Age of Onset  . Cancer Father   . Prostate cancer Father   . Pancreatic cancer Father     . Cancer Maternal Aunt   . Cervical cancer Maternal Aunt     Social History:  Social History   Socioeconomic History  . Marital status: Divorced    Spouse name: Not on file  . Number of children: Not on file  . Years of education: Not on file  . Highest education level: Not on file  Occupational History  . Not on file  Social Needs  . Financial resource strain: Not on file  . Food insecurity:    Worry: Not on file    Inability: Not on file  . Transportation needs:    Medical: Not on file    Non-medical: Not on file  Tobacco Use  . Smoking status: Current Every Day Smoker    Packs/day: 0.50    Types: Cigarettes  . Smokeless tobacco: Never Used  Substance and Sexual Activity  . Alcohol use: No    Frequency: Never  . Drug use: No  . Sexual activity: Not Currently    Birth control/protection: None  Lifestyle  . Physical activity:    Days per week: Not on file    Minutes per session: Not on file  . Stress: Not on file  Relationships  . Social connections:    Talks on phone: Not on file    Gets together: Not on file    Attends religious service: Not on file    Active member of club or organization: Not on file    Attends meetings of clubs or organizations:  Not on file    Relationship status: Not on file  . Intimate partner violence:    Fear of current or ex partner: Not on file    Emotionally abused: Not on file    Physically abused: Not on file    Forced sexual activity: Not on file  Other Topics Concern  . Not on file  Social History Narrative  . Not on file    Allergies:  No Active Allergies  Medications: Prior to Admission medications   Medication Sig Start Date End Date Taking? Authorizing Provider  hydrochlorothiazide (HYDRODIURIL) 25 MG tablet Take 1 tablet (25 mg total) by mouth daily. 01/18/18   Triplett, Johnette Abraham B, FNP  meloxicam (MOBIC) 15 MG tablet Take 1 tablet (15 mg total) by mouth daily. 01/18/18   Triplett, Johnette Abraham B, FNP  oxyCODONE-acetaminophen  (PERCOCET) 5-325 MG tablet Take 1 tablet by mouth every 4 (four) hours as needed for severe pain. 06/14/18   Harvest Dark, MD  predniSONE (STERAPRED UNI-PAK 21 TAB) 10 MG (21) TBPK tablet Take 6 tablets on day 1, take 5 tablets on day 2, take 4 tablets on day 3, take 3 tablets on day 4, take 2 tablets on day 5, take 1 tablet on day 6 11/07/16   Laban Emperor, PA-C  traMADol (ULTRAM) 50 MG tablet Take 1 tablet (50 mg total) by mouth every 6 (six) hours as needed. 01/18/18   Victorino Dike, FNP    Physical Exam Vitals:  Vitals:   06/21/18 1417  BP: 110/70  Pulse: 80   Patient's last menstrual period was 10/31/2016.  General: NAD HEENT: normocephalic, anicteric Pulmonary: No increased work of breathing Abdomen: NABS, soft, non-tender, non-distended.  Umbilicus without lesions.  No hepatomegaly, splenomegaly or masses palpable. No evidence of hernia  Neurologic: Grossly intact Psychiatric: mood appropriate, affect full  Female chaperone present for pelvic and breast  portions of the physical exam  US Pelvis Transvanginal Non-ob (tv Only)  Result Date: 06/14/2018 CLINICAL DATA:  47 year old female with pelvic pain and RIGHT ovarian cyst identified on recent CT. History of hysterectomy. EXAM: TRANSABDOMINAL AND TRANSVAGINAL ULTRASOUND OF PELVIS TECHNIQUE: Both transabdominal and transvaginal ultrasound examinations of the pelvis were performed. Transabdominal technique was performed for global imaging of the pelvis including uterus, ovaries, adnexal regions, and pelvic cul-de-sac. It was necessary to proceed with endovaginal exam following the transabdominal exam to visualize the ovaries and adnexal regions. COMPARISON:  06/14/2018 CT FINDINGS: Uterus Not visualized compatible with hysterectomy. Right ovary Measurements: 5.5 x 6 x 5.6 cm. A 5.8 x 5.4 x 4.6 cm hypoechoic mass/cyst with posterior acoustic enhancement noted without internal vascular flow. Left ovary Not visualized Other  findings A trace amount of free fluid is noted. IMPRESSION: 1. 5.8 cm probable cyst within the RIGHT ovary and likely benign. Given imaging characteristics, this could represent an endometrioma. Recommend pelvic ultrasound follow-up in 6-12 weeks. 2. LEFT ovary not visualized. 3. Status post hysterectomy. Electronically Signed   By: Margarette Canada M.D.   On: 06/14/2018 16:00   US Pelvis Complete  Result Date: 06/14/2018 CLINICAL DATA:  47 year old female with pelvic pain and RIGHT ovarian cyst identified on recent CT. History of hysterectomy. EXAM: TRANSABDOMINAL AND TRANSVAGINAL ULTRASOUND OF PELVIS TECHNIQUE: Both transabdominal and transvaginal ultrasound examinations of the pelvis were performed. Transabdominal technique was performed for global imaging of the pelvis including uterus, ovaries, adnexal regions, and pelvic cul-de-sac. It was necessary to proceed with endovaginal exam following the transabdominal exam to visualize the ovaries and  adnexal regions. COMPARISON:  06/14/2018 CT FINDINGS: Uterus Not visualized compatible with hysterectomy. Right ovary Measurements: 5.5 x 6 x 5.6 cm. A 5.8 x 5.4 x 4.6 cm hypoechoic mass/cyst with posterior acoustic enhancement noted without internal vascular flow. Left ovary Not visualized Other findings A trace amount of free fluid is noted. IMPRESSION: 1. 5.8 cm probable cyst within the RIGHT ovary and likely benign. Given imaging characteristics, this could represent an endometrioma. Recommend pelvic ultrasound follow-up in 6-12 weeks. 2. LEFT ovary not visualized. 3. Status post hysterectomy. Electronically Signed   By: Margarette Canada M.D.   On: 06/14/2018 16:00   Ct Abdomen Pelvis W Contrast  Result Date: 06/14/2018 CLINICAL DATA:  Abdominal pain EXAM: CT ABDOMEN AND PELVIS WITH CONTRAST TECHNIQUE: Multidetector CT imaging of the abdomen and pelvis was performed using the standard protocol following bolus administration of intravenous contrast. CONTRAST:  133mL  ISOVUE-300 IOPAMIDOL (ISOVUE-300) INJECTION 61% COMPARISON:  None. FINDINGS: Lower chest: Lung bases demonstrate no acute consolidation or pleural effusion. The heart size is upper normal. Hepatobiliary: No focal liver abnormality is seen. No gallstones, gallbladder wall thickening, or biliary dilatation. Pancreas: Unremarkable. No pancreatic ductal dilatation or surrounding inflammatory changes. Spleen: Normal in size without focal abnormality. Adrenals/Urinary Tract: Adrenal glands are unremarkable. Kidneys are normal, without renal calculi, focal lesion, or hydronephrosis. Bladder is unremarkable. Stomach/Bowel: Stomach is nonenlarged. No dilated small bowel. Status post appendectomy. Sigmoid colon diverticular disease without convincing evidence for acute inflammation. Vascular/Lymphatic: Mild aortic atherosclerosis. No aneurysm. No significantly enlarged lymph nodes. Reproductive: Status post hysterectomy. Thick-walled hypodense mass in the right adnexa measuring 5.3 x 5.6 by 6.4 cm. No substantial surrounding inflammatory changes. Other: Negative for free air or significant free fluid. Moderate infraumbilical ventral hernia containing fat and small bowel but no evidence for obstruction or incarceration. Musculoskeletal: No acute or suspicious osseous lesion IMPRESSION: 1. Sigmoid colon diverticular disease without definitive acute inflammatory process. 2. Slightly thick-walled appearing complex cyst or cystic mass in the right adnexa measuring up to 6.4 cm. Pelvic ultrasound evaluation is advised. 3. Infraumbilical ventral hernia containing fat and small bowel but negative for obstruction or incarceration. Electronically Signed   By: Donavan Foil M.D.   On: 06/14/2018 14:44    Assessment: 47 y.o. presenting for right ovarian cyst  Plan: Problem List Items Addressed This Visit    None    Visit Diagnoses    Ovarian mass, right    -  Primary   Relevant Orders   US Transvaginal Non-OB   Right lower  quadrant pain       Umbilical hernia without obstruction and without gangrene       Relevant Orders   Ambulatory referral to General Surgery   Encounter for medical examination to establish care       Relevant Orders   Ambulatory referral to Internal Medicine       1)  The incidence and implication of adnexal masses and ovarian cysts were discussed with the patient in detail.  Prior imaging if available was reviewed at today's visit..  The vast majority of these lesions will represent benign or physiologic processes and may well resolve on repeat imaging with expectant management.  We discussed that in a premenopausal patient not on ovulation suppression with via a systemic form hormonal contraception the normal function of the ovary during follicular development is the formation of a dominant follicle or cyst(s) every month.  This is an essential part of normal reproductive physiology.  In some cases these  cysts can take on larger dimensions, hemorrhage, or undergo torsion making them symptomatic. Torsion is relatively unlikely for lesions under 5 cm.  Based on initial imaging findings the overall concern for malignancy is deemed low.  We will obtain follow up imagine approximately 6 weeks from the date of the initial imaging study.  Torsion precautions were given.   - should the right ovarian cyst not resolve (I favor hemorrhagic cyst over endometrioma) I will refer patient to general surgery for possible concurrent hernia repair at time of oophorectomy.  2) Tumor makers were not ordered  3) IOTA LR2 score showing a LR of 0.1% risk of malignancy  4) Referral to PCP - amlodipine and HCT refilled until able to establish locally  5)  Return in about 6 weeks (around 08/02/2018) for Follow up ultrasound.    Malachy Mood, MD, [facility], [facility] 06/21/2018, 2:18 PM

## 2018-06-24 ENCOUNTER — Ambulatory Visit (INDEPENDENT_AMBULATORY_CARE_PROVIDER_SITE_OTHER): Payer: BLUE CROSS/BLUE SHIELD | Admitting: Surgery

## 2018-06-24 ENCOUNTER — Encounter: Payer: Self-pay | Admitting: Surgery

## 2018-06-24 ENCOUNTER — Other Ambulatory Visit: Payer: Self-pay

## 2018-06-24 ENCOUNTER — Encounter: Payer: Self-pay | Admitting: *Deleted

## 2018-06-24 VITALS — BP 137/95 | HR 77 | Temp 97.7°F | Resp 20 | Ht 71.0 in | Wt 312.4 lb

## 2018-06-24 DIAGNOSIS — K439 Ventral hernia without obstruction or gangrene: Secondary | ICD-10-CM | POA: Diagnosis not present

## 2018-06-24 DIAGNOSIS — K5909 Other constipation: Secondary | ICD-10-CM

## 2018-06-24 NOTE — Progress Notes (Signed)
Surgical Clinic History and Physical  Referring provider:  Malachy Mood, MD [address] [place], [place] [address]  HISTORY OF PRESENT ILLNESS (HPI):  47 y.o. female presents for evaluation of her infra-umbilical hernia. Patient reports her infra-umbilical hernia has caused her pain and discomfort >10 years, associated with chronic constipation x 27 years since she was pregnant with her daughter. She says she'd hoped it could be fixed at the time of her hysterectomy and primary repair if another hernia 1 year ago ([facility]), but she was told there was an increased risk for infection if mesh were to be placed at the same time as her hysterectomy. Patient says she occasionally takes Miralax, but doesn't take anything consistently for constipation. She says her friends encourage her to drink water, but she acknowledges she doesn't eat enough high-fiber foods. Along with constipation, patient also reports periodic blood per rectum, which she says was attributed >10 years ago to hemorrhoids. She also >10 years ago underwent colonoscopy on which polyps were identified. Follow-up colonoscopy was advised, but was not sought/performed despite what patient describes as an extensive family history of deaths due to colon cancer. 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 She also says she wants to lose weight and expresses interest in bariatric surgery and medical weight loss programs. Patient otherwise denies any unintentional weight loss, N/V, fever/chills, CP, or SOB.  PAST MEDICAL HISTORY (PMH):  Past Medical History:  Diagnosis Date  . Anemia   . Arthritis   . Bipolar 1 disorder ([place])   . Bronchitis   . Chronic bronchitis ([place])   . Depression   .  Hypertension   . Sickle cell trait ([place])    PAST SURGICAL HISTORY ([place]):  Past Surgical History:  Procedure Laterality Date  . ABDOMINAL HYSTERECTOMY    . APPENDECTOMY    . CESAREAN SECTION     MEDICATIONS:  Prior to Admission medications   Medication Sig Start Date End Date Taking? Authorizing Provider  amLODipine (NORVASC) 5 MG tablet Take 1 tablet (5 mg total) by mouth daily. 06/21/18  Yes Malachy Mood, MD  hydrochlorothiazide (HYDRODIURIL) 25 MG tablet Take 1 tablet (25 mg total) by mouth daily. 06/21/18  Yes Malachy Mood, MD  oxyCODONE-acetaminophen (PERCOCET) 5-325 MG tablet Take 1 tablet by mouth every 4 (four) hours as needed for severe pain. 06/14/18  Yes Harvest Dark, MD   ALLERGIES:  No Known Allergies   SOCIAL HISTORY:  Social History   Socioeconomic History  . Marital status: Divorced    Spouse name: Not on file  . Number of children: 2  . Years of education: Not on file  . Highest education level: Not on file  Occupational History  . Not on file  Social Needs  . Financial resource strain: Not on file  . Food insecurity:    Worry: Not on file    Inability: Not on file  . Transportation needs:    Medical: Not on file    Non-medical: Not on file  Tobacco Use  . Smoking status: Current Every Day Smoker    Packs/day: 0.50    Types: Cigarettes  . Smokeless tobacco: Never Used  Substance and Sexual Activity  . Alcohol use: No    Frequency: Never  . Drug  use: No  . Sexual activity: Not Currently    Birth control/protection: None  Lifestyle  . Physical activity:    Days per week: Not on file    Minutes per session: Not on file  . Stress: Not on file  Relationships  . Social connections:    Talks on phone: Not on file    Gets together: Not on file    Attends religious service: Not on file    Active member of club or organization: Not on file    Attends meetings of clubs or organizations: Not on file    Relationship status: Not on file   . Intimate partner violence:    Fear of current or ex partner: Not on file    Emotionally abused: Not on file    Physically abused: Not on file    Forced sexual activity: Not on file  Other Topics Concern  . Not on file  Social History Narrative  . Not on file    The patient currently resides (home / rehab facility / nursing home): Home The patient normally is (ambulatory / bedbound): Ambulatory  FAMILY HISTORY:  Family History  Problem Relation Age of Onset  . Cancer Father   . Prostate cancer Father   . Pancreatic cancer Father   . Cancer Maternal Aunt   . Cervical cancer Maternal Aunt     Otherwise negative/non-contributory.  REVIEW OF SYSTEMS:  Constitutional: denies any other weight loss, fever, chills, or sweats  Eyes: denies any other vision changes, history of eye injury  ENT: denies sore throat, hearing problems  Respiratory: denies shortness of breath, wheezing  Cardiovascular: denies chest pain, palpitations  Gastrointestinal: abdominal pain, N/V, and bowel function as per HPI Musculoskeletal: denies any other joint pains or cramps  Skin: Denies any other rashes or skin discolorations Neurological: denies any other headache, dizziness, weakness  Psychiatric: Denies any other depression, anxiety   All other review of systems were otherwise negative   VITAL SIGNS:  BP (!) 137/95   Pulse 77   Temp 97.7 F (36.5 C) (Temporal)   Resp 20   Ht 5\' 11"  (1.803 m)   Wt (!) 312 lb 6.4 oz (141.7 kg)   LMP 10/31/2016   SpO2 97%   BMI 43.57 kg/m   PHYSICAL EXAM:  Constitutional:  -- Morbidly obese body habitus  -- Awake, alert, and oriented x3  Eyes:  -- Pupils equally round and reactive to light  -- No scleral icterus  Ear, nose, throat:  -- No jugular venous distension -- No nasal drainage, bleeding Pulmonary:  -- No crackles  -- Equal breath sounds bilaterally -- Breathing non-labored at rest Cardiovascular:  -- S1, S2 present  -- No pericardial  rubs  Gastrointestinal:  -- Abdomen soft and non-distended without guarding/rebound tenderness -- Easily reducible infra-umbilical hernia ~3 cm inferior to umbilicus, not associated with any visible surgical scar  -- No other abdominal masses appreciated, pulsatile or otherwise  Musculoskeletal and Integumentary:  -- Wounds or skin discoloration: None appreciated -- Extremities: B/L UE and LE FROM, hands and feet warm, no edema  Neurologic:  -- Motor function: Intact and symmetric -- Sensation: Intact and symmetric  Labs:  CBC Latest Ref Rng & Units 06/14/2018 07/31/2017  WBC 4.0 - 10.5 K/uL 8.0 7.6  Hemoglobin 12.0 - 15.0 g/dL 14.6 14.0  Hematocrit 36.0 - 46.0 % 41.8 42.0  Platelets 150 - 400 K/uL 218 205   CMP Latest Ref Rng & Units 06/14/2018 07/31/2017  Glucose 70 - 99 mg/dL 96 100(H)  BUN 6 - 20 mg/dL 11 12  Creatinine 0.44 - 1.00 mg/dL 0.63 0.71  Sodium 135 - 145 mmol/L 137 136  Potassium 3.5 - 5.1 mmol/L 4.1 4.0  Chloride 98 - 111 mmol/L 108 106  CO2 22 - 32 mmol/L 25 27  Calcium 8.9 - 10.3 mg/dL 9.8 10.0  Total Protein 6.5 - 8.1 g/dL 7.1 7.2  Total Bilirubin 0.3 - 1.2 mg/dL 0.7 0.8  Alkaline Phos 38 - 126 U/L 89 81  AST 15 - 41 U/L 19 24  ALT 0 - 44 U/L 20 17   Imaging studies:  CT Abdomen and Pelvis with Contrast (06/14/2018) - personally reviewed and discussed with patient 1. Sigmoid colon diverticular disease without definitive acute inflammatory process. 2. Slightly thick-walled appearing complex cyst or cystic mass in the right adnexa measuring up to 6.4 cm. Pelvic ultrasound evaluation is advised. 3. Infraumbilical ventral hernia containing fat and small bowel but negative for obstruction or incarceration.  Assessment/Plan: 47 y.o. female with increasingly symptomatic (painful) easily reducible infra-umbilical ventral hernia, complicated by chronic constipation, blood per rectum, polyps on prior colonoscopy, and overdue for previously recommended follow-up  colonoscopy despite reported extensive family history of  pertinent comorbidities including morbid obesity (BMI 59), HTN, chronic bronchitis, sickle cell trait, osteoarthritis, and bipolar disorder.               - discussed with patient signs and symptoms of hernia incarceration and obstruction             - strategies for manual self-reduction of patient's hernia also reviewed and discussed  - maintain hydration with high fiber heart healthy diet and once - twice daily stool softener to help reduce/minimize constipation  - considering blood per rectum with constipation, history of polyps, and extensive family history of colon cancer along with overdue for previously recommended follow-up colonoscopy, will refer for colonoscopy             - all risks, benefits, and alternatives to laparoscopic repair of infra-umbilical hernia with mesh were discussed with the patient, all of her questions were answered to patient's expressed satisfaction, patient expresses she wishes to proceed.             - return to clinic for follow-up and to schedule anticipated laparoscopic repair of infra-umbilical hernia with mesh after colonoscopy             - information about bariatric surgery and bariatric medical options provided as per patient's request             - instructed to call if any questions or concerns  - smoking cessation encouraged  All of the above recommendations were discussed with the patient, and all of patient's questions were answered to her expressed satisfaction.  Thank you for the opportunity to participate in this patient's care.  -- Marilynne Drivers Rosana Hoes, MD, [place]: [facility] - Partnering for exceptional care. Office: [phone]

## 2018-06-24 NOTE — Patient Instructions (Addendum)
Patient will need to be scheduled for hernia repair surgery with Dr.Davis. Refer patient for colonoscopy and follow up after with Dr.Davis after colonoscopy to schedule surgery. Patient will receive weight loss management information to help with weight loss.   Please call the office with any questions or concerns.     Ventral Hernia A ventral hernia is a bulge of tissue from inside the abdomen that pushes through a weak area of the muscles that form the front wall of the abdomen. The tissues inside the abdomen are inside a sac (peritoneum). These tissues include the small intestine, large intestine, and the fatty tissue that covers the intestines (omentum). Sometimes, the bulge that forms a hernia contains intestines. Other hernias contain only fat. Ventral hernias do not go away without surgical treatment. There are several types of ventral hernias. You may have:  A hernia at an incision site from previous abdominal surgery (incisional hernia).  A hernia just above the belly button (epigastric hernia), or at the belly button (umbilical hernia). These types of hernias can develop from heavy lifting or straining.  A hernia that comes and goes (reducible hernia). It may be visible only when you lift or strain. This type of hernia can be pushed back into the abdomen (reduced).  A hernia that traps abdominal tissue inside the hernia (incarcerated hernia). This type of hernia does not reduce.  A hernia that cuts off blood flow to the tissues inside the hernia (strangulated hernia). The tissues can start to die if this happens. This is a very painful bulge that cannot be reduced. A strangulated hernia is a medical emergency.  What are the causes? This condition is caused by abdominal tissue putting pressure on an area of weakness in the abdominal muscles. What increases the risk? The following factors may make you more likely to develop this condition:  Being female.  Being 56 or older.  Being  overweight or obese.  Having had previous abdominal surgery, especially if there was an infection after surgery.  Having had an injury to the abdominal wall.  Having had several pregnancies.  Having a buildup of fluid inside the abdomen (ascites).  What are the signs or symptoms? The only symptom of a ventral hernia may be a painless bulge in the abdomen. A reducible hernia may be visible only when you strain, cough, or lift. Other symptoms may include:  Dull pain.  A feeling of pressure.  Signs and symptoms of a strangulated hernia may include:  Increasing pain.  Nausea and vomiting.  Pain when pressing on the hernia.  The skin over the hernia turning red or purple.  Constipation.  Blood in the stool (feces).  How is this diagnosed? This condition may be diagnosed based on:  Your symptoms.  Your medical history.  A physical exam. You may be asked to cough or strain while standing. These actions increase the pressure inside your abdomen and force the hernia through the opening in your muscles. Your health care provider may try to reduce the hernia by pressing on it.  Imaging studies, such as an ultrasound or CT scan.  How is this treated? This condition is treated with surgery. If you have a strangulated hernia, surgery is done as soon as possible. If your hernia is small and not incarcerated, you may be asked to lose some weight before surgery. Follow these instructions at home:  Follow instructions from your health care provider about eating or drinking restrictions.  If you are overweight, your health  care provider may recommend that you increase your activity level and eat a healthier diet.  Do not lift anything that is heavier than 10 lb (4.5 kg).  Return to your normal activities as told by your health care provider. Ask your health care provider what activities are safe for you. You may need to avoid activities that increase pressure on your hernia.  Take  over-the-counter and prescription medicines only as told by your health care provider.  Keep all follow-up visits as told by your health care provider. This is important. Contact a health care provider if:  Your hernia gets larger.  Your hernia becomes painful. Get help right away if:  Your hernia becomes increasingly painful.  You have pain along with any of the following: ? Changes in skin color in the area of the hernia. ? Nausea. ? Vomiting. ? Fever. Summary  A ventral hernia is a bulge of tissue from inside the abdomen that pushes through a weak area of the muscles that form the front wall of the abdomen.  This condition is treated with surgery, which may be urgent depending on your hernia.  Do not lift anything that is heavier than 10 lb (4.5 kg), and follow activity instructions from your health care provider. This information is not intended to replace advice given to you by your health care provider. Make sure you discuss any questions you have with your health care provider. Document Released: 07/14/2012 Document Revised: 03/14/2016 Document Reviewed: 03/14/2016 Elsevier Interactive Patient Education  [employer].

## 2018-06-24 NOTE — Progress Notes (Signed)
Patient has been mailed weight loss information.   A referral has been made for patient to have a colonoscopy due to constipation with bright red blood per rectum to [facility]. She is aware their office will be contacting her regarding an appointment.   Once colonoscopy is done, we will have the patient follow up in the office to further discuss umbilical hernia repair with Dr. Rosana Hoes.

## 2018-07-01 ENCOUNTER — Encounter: Payer: Self-pay | Admitting: Gastroenterology

## 2018-07-01 ENCOUNTER — Ambulatory Visit: Payer: BLUE CROSS/BLUE SHIELD | Admitting: Gastroenterology

## 2018-07-01 ENCOUNTER — Other Ambulatory Visit: Payer: Self-pay

## 2018-07-01 ENCOUNTER — Ambulatory Visit (INDEPENDENT_AMBULATORY_CARE_PROVIDER_SITE_OTHER): Payer: BLUE CROSS/BLUE SHIELD | Admitting: Gastroenterology

## 2018-07-01 VITALS — BP 140/105 | Ht 71.0 in

## 2018-07-01 DIAGNOSIS — F419 Anxiety disorder, unspecified: Secondary | ICD-10-CM | POA: Insufficient documentation

## 2018-07-01 DIAGNOSIS — F329 Major depressive disorder, single episode, unspecified: Secondary | ICD-10-CM | POA: Insufficient documentation

## 2018-07-01 DIAGNOSIS — J42 Unspecified chronic bronchitis: Secondary | ICD-10-CM | POA: Insufficient documentation

## 2018-07-01 DIAGNOSIS — D573 Sickle-cell trait: Secondary | ICD-10-CM | POA: Insufficient documentation

## 2018-07-01 DIAGNOSIS — F32A Depression, unspecified: Secondary | ICD-10-CM | POA: Insufficient documentation

## 2018-07-01 DIAGNOSIS — F319 Bipolar disorder, unspecified: Secondary | ICD-10-CM | POA: Insufficient documentation

## 2018-07-01 DIAGNOSIS — R718 Other abnormality of red blood cells: Secondary | ICD-10-CM | POA: Diagnosis not present

## 2018-07-01 DIAGNOSIS — K5900 Constipation, unspecified: Secondary | ICD-10-CM | POA: Diagnosis not present

## 2018-07-01 DIAGNOSIS — Z1211 Encounter for screening for malignant neoplasm of colon: Secondary | ICD-10-CM

## 2018-07-01 DIAGNOSIS — K625 Hemorrhage of anus and rectum: Secondary | ICD-10-CM

## 2018-07-02 ENCOUNTER — Telehealth: Payer: Self-pay

## 2018-07-02 NOTE — Telephone Encounter (Signed)
Pt will come by office on Monday at 1:00 pm to have labs done.

## 2018-07-02 NOTE — Progress Notes (Signed)
Dawn Alvarado [address]  [place]  [place], [place] [address]  Main: [phone]  Fax: [phone]   Gastroenterology Consultation  Referring Provider:     Vickie Epley, MD Primary Care Physician:  Patient, No Pcp Per Primary Gastroenterologist:  Dr. Vonda Alvarado Reason for Consultation:     Screening colonoscopy, intermittent bright red blood per rectum        HPI:    Chief Complaint  Patient presents with  . New Patient (Initial Visit)    BRBR Constipation    Dawn Alvarado is a 47 y.o. y/o female referred for consultation & management  by Dr. Rosana Hoes.  Patient recently seen by surgery for a reducible infraumbilical ventral hernia, and surgery would like patient to be evaluated for colonoscopy due to intermittent bright red blood per rectum about once a week and constipation.  Patient reports intermittent constipation, and does not take anything for this.  Reports minute amount of bright red blood in tissue paper, and blood within the stool when she strains.  No weight loss.  No dysphagia.  No nausea or vomiting.  Patient denies any previous EGD or colonoscopy.  States her brother was recently diagnosed with possible rectal cancer.  Past Medical History:  Diagnosis Date  . Anemia   . Arthritis   . Bipolar 1 disorder ([place])   . Bronchitis   . Chronic bronchitis ([place])   . Depression   . Hypertension   . Sickle cell trait [facility])     Past Surgical History:  Procedure Laterality Date  . ABDOMINAL HYSTERECTOMY    . APPENDECTOMY    . CESAREAN SECTION      Prior to Admission medications   Medication Sig Start Date End Date Taking? Authorizing Provider  amLODipine (NORVASC) 5 MG tablet Take 1 tablet (5 mg total) by mouth daily. 06/21/18  Yes Malachy Mood, MD  hydrochlorothiazide (HYDRODIURIL) 25 MG tablet Take 1 tablet (25 mg total) by mouth daily. 06/21/18  Yes Malachy Mood, MD  oxyCODONE-acetaminophen (PERCOCET) 5-325 MG tablet Take 1  tablet by mouth every 4 (four) hours as needed for severe pain. 06/14/18  Yes Harvest Dark, MD    Family History  Problem Relation Age of Onset  . Cancer Father   . Prostate cancer Father   . Pancreatic cancer Father   . Cancer Maternal Aunt   . Cervical cancer Maternal Aunt      Social History   Tobacco Use  . Smoking status: Current Every Day Smoker    Packs/day: 0.50    Types: Cigarettes  . Smokeless tobacco: Never Used  Substance Use Topics  . Alcohol use: No    Frequency: Never  . Drug use: No    Allergies as of 07/01/2018  . (No Known Allergies)    Review of Systems:    All systems reviewed and negative except where noted in HPI.   Physical Exam:  BP (!) 140/105   Ht 5\' 11"  (1.803 m)   LMP 10/31/2016   BMI 43.57 kg/m  Patient's last menstrual period was 10/31/2016. Psych:  Alert and cooperative. Normal mood and affect. General:   Alert,  Well-developed, well-nourished, pleasant and cooperative in NAD Head:  Normocephalic and atraumatic. Eyes:  Sclera clear, no icterus.   Conjunctiva pink. Ears:  Normal auditory acuity. Nose:  No deformity, discharge, or lesions. Mouth:  No deformity or lesions,oropharynx pink & moist. Neck:  Supple; no masses or thyromegaly. Abdomen:  Normal bowel sounds.  No bruits.  Soft, non-tender and non-distended without masses, hepatosplenomegaly or hernias noted.  No guarding or rebound tenderness.    Msk:  Symmetrical without gross deformities. Good, equal movement & strength bilaterally. Pulses:  Normal pulses noted. Extremities:  No clubbing or edema.  No cyanosis. Neurologic:  Alert and oriented x3;  grossly normal neurologically. Skin:  Intact without significant lesions or rashes. No jaundice. Lymph Nodes:  No significant cervical adenopathy. Psych:  Alert and cooperative. Normal mood and affect.   Labs: CBC    Component Value Date/Time   WBC 8.0 06/14/2018 0928   RBC 5.53 (H) 06/14/2018 0928   HGB 14.6  06/14/2018 0928   HCT 41.8 06/14/2018 0928   PLT 218 06/14/2018 0928   MCV 75.6 (L) 06/14/2018 0928   MCH 26.4 06/14/2018 0928   MCHC 34.9 06/14/2018 0928   RDW 14.0 06/14/2018 0928   LYMPHSABS 2.1 07/31/2017 1006   MONOABS 1.0 (H) 07/31/2017 1006   EOSABS 0.1 07/31/2017 1006   BASOSABS 0.1 07/31/2017 1006   CMP     Component Value Date/Time   NA 137 06/14/2018 0928   K 4.1 06/14/2018 0928   CL 108 06/14/2018 0928   CO2 25 06/14/2018 0928   GLUCOSE 96 06/14/2018 0928   BUN 11 06/14/2018 0928   CREATININE 0.63 06/14/2018 0928   CALCIUM 9.8 06/14/2018 0928   PROT 7.1 06/14/2018 0928   ALBUMIN 3.9 06/14/2018 0928   AST 19 06/14/2018 0928   ALT 20 06/14/2018 0928   ALKPHOS 89 06/14/2018 0928   BILITOT 0.7 06/14/2018 0928   GFRNONAA >60 06/14/2018 0928   GFRAA >60 06/14/2018 0928    Imaging Studies: US Pelvis Transvanginal Non-ob (tv Only)  Result Date: 06/14/2018 CLINICAL DATA:  47 year old female with pelvic pain and RIGHT ovarian cyst identified on recent CT. History of hysterectomy. EXAM: TRANSABDOMINAL AND TRANSVAGINAL ULTRASOUND OF PELVIS TECHNIQUE: Both transabdominal and transvaginal ultrasound examinations of the pelvis were performed. Transabdominal technique was performed for global imaging of the pelvis including uterus, ovaries, adnexal regions, and pelvic cul-de-sac. It was necessary to proceed with endovaginal exam following the transabdominal exam to visualize the ovaries and adnexal regions. COMPARISON:  06/14/2018 CT FINDINGS: Uterus Not visualized compatible with hysterectomy. Right ovary Measurements: 5.5 x 6 x 5.6 cm. A 5.8 x 5.4 x 4.6 cm hypoechoic mass/cyst with posterior acoustic enhancement noted without internal vascular flow. Left ovary Not visualized Other findings A trace amount of free fluid is noted. IMPRESSION: 1. 5.8 cm probable cyst within the RIGHT ovary and likely benign. Given imaging characteristics, this could represent an endometrioma. Recommend  pelvic ultrasound follow-up in 6-12 weeks. 2. LEFT ovary not visualized. 3. Status post hysterectomy. Electronically Signed   By: Margarette Canada M.D.   On: 06/14/2018 16:00   US Pelvis Complete  Result Date: 06/14/2018 CLINICAL DATA:  47 year old female with pelvic pain and RIGHT ovarian cyst identified on recent CT. History of hysterectomy. EXAM: TRANSABDOMINAL AND TRANSVAGINAL ULTRASOUND OF PELVIS TECHNIQUE: Both transabdominal and transvaginal ultrasound examinations of the pelvis were performed. Transabdominal technique was performed for global imaging of the pelvis including uterus, ovaries, adnexal regions, and pelvic cul-de-sac. It was necessary to proceed with endovaginal exam following the transabdominal exam to visualize the ovaries and adnexal regions. COMPARISON:  06/14/2018 CT FINDINGS: Uterus Not visualized compatible with hysterectomy. Right ovary Measurements: 5.5 x 6 x 5.6 cm. A 5.8 x 5.4 x 4.6 cm hypoechoic mass/cyst with posterior acoustic enhancement noted without internal vascular flow. Left ovary Not visualized Other  findings A trace amount of free fluid is noted. IMPRESSION: 1. 5.8 cm probable cyst within the RIGHT ovary and likely benign. Given imaging characteristics, this could represent an endometrioma. Recommend pelvic ultrasound follow-up in 6-12 weeks. 2. LEFT ovary not visualized. 3. Status post hysterectomy. Electronically Signed   By: Margarette Canada M.D.   On: 06/14/2018 16:00   Ct Abdomen Pelvis W Contrast  Result Date: 06/14/2018 CLINICAL DATA:  Abdominal pain EXAM: CT ABDOMEN AND PELVIS WITH CONTRAST TECHNIQUE: Multidetector CT imaging of the abdomen and pelvis was performed using the standard protocol following bolus administration of intravenous contrast. CONTRAST:  153mL ISOVUE-300 IOPAMIDOL (ISOVUE-300) INJECTION 61% COMPARISON:  None. FINDINGS: Lower chest: Lung bases demonstrate no acute consolidation or pleural effusion. The heart size is upper normal. Hepatobiliary: No  focal liver abnormality is seen. No gallstones, gallbladder wall thickening, or biliary dilatation. Pancreas: Unremarkable. No pancreatic ductal dilatation or surrounding inflammatory changes. Spleen: Normal in size without focal abnormality. Adrenals/Urinary Tract: Adrenal glands are unremarkable. Kidneys are normal, without renal calculi, focal lesion, or hydronephrosis. Bladder is unremarkable. Stomach/Bowel: Stomach is nonenlarged. No dilated small bowel. Status post appendectomy. Sigmoid colon diverticular disease without convincing evidence for acute inflammation. Vascular/Lymphatic: Mild aortic atherosclerosis. No aneurysm. No significantly enlarged lymph nodes. Reproductive: Status post hysterectomy. Thick-walled hypodense mass in the right adnexa measuring 5.3 x 5.6 by 6.4 cm. No substantial surrounding inflammatory changes. Other: Negative for free air or significant free fluid. Moderate infraumbilical ventral hernia containing fat and small bowel but no evidence for obstruction or incarceration. Musculoskeletal: No acute or suspicious osseous lesion IMPRESSION: 1. Sigmoid colon diverticular disease without definitive acute inflammatory process. 2. Slightly thick-walled appearing complex cyst or cystic mass in the right adnexa measuring up to 6.4 cm. Pelvic ultrasound evaluation is advised. 3. Infraumbilical ventral hernia containing fat and small bowel but negative for obstruction or incarceration. Electronically Signed   By: Donavan Foil M.D.   On: 06/14/2018 14:44    Assessment and Plan:   Mckena Chern is a 47 y.o. y/o female has been referred for intermittent bright red blood per rectum and evaluation for colonoscopy  Patient has family history of rectal cancer in her brother Therefore, she qualifies for high risk screening Patient is agreeable with proceeding with the colonoscopy  Her hemoglobin is normal but her MCV is low at 75 We will check ferritin and iron labs and if low can to  refer to hematology for iron deficiency, and also proceed with EGD during her colonoscopy If ferritin and iron labs are normal, she may have microcytosis from other causes such as thalassemia and can refer to hematology for that as well  I have discussed alternative options, risks & benefits,  which include, but are not limited to, bleeding, infection, perforation,respiratory complication & drug reaction.  The patient agrees with this plan & written consent will be obtained.    Her intermittent high blood work for her rectum is likely due to underlying hemorrhoids from constipation High-fiber diet MiraLAX daily with goal of 1-2 soft bowel movements daily.  If not at goal, patient instructed to increase dose to twice daily.  If loose stools with the medication, patient asked to decrease the medication to every other day, or half dose daily.  Patient verbalized understanding  Her hemoglobin is otherwise reassuring   Dr Dawn Alvarado  Speech recognition software was used to dictate the above note.

## 2018-07-02 NOTE — Telephone Encounter (Signed)
-----   Message from Virgel Manifold, MD sent at 07/02/2018 12:15 PM EST ----- Dawn Alvarado, With this patient was here yesterday and had pended ferritin and iron labs and this do not see that this was done.  She has not had them done, can you please ask her to get them done either at the hospital or at [facility] which ever she prefers.  I have released them, you may need to reorder them if they are ordered incorrectly.  They are not urgent, so she can have them done at her convenience, but preferably before her colonoscopy.

## 2018-07-06 LAB — IRON AND TIBC
IRON SATURATION: 27 % (ref 15–55)
IRON: 82 ug/dL (ref 27–159)
TIBC: 307 ug/dL (ref 250–450)
UIBC: 225 ug/dL (ref 131–425)

## 2018-07-06 LAB — FERRITIN: FERRITIN: 32 ng/mL (ref 15–150)

## 2018-07-14 ENCOUNTER — Encounter
Admission: RE | Disposition: A | Discharge status: Home / Self Care | Payer: Self-pay | Source: Ambulatory Visit | Attending: Gastroenterology

## 2018-07-14 ENCOUNTER — Ambulatory Visit: Payer: BLUE CROSS/BLUE SHIELD | Admitting: Anesthesiology

## 2018-07-14 ENCOUNTER — Ambulatory Visit
Admission: RE | Admit: 2018-07-14 | Discharge: 2018-07-14 | Disposition: A | Discharge status: Home / Self Care | Payer: BLUE CROSS/BLUE SHIELD | Source: Ambulatory Visit | Attending: Gastroenterology | Admitting: Gastroenterology

## 2018-07-14 ENCOUNTER — Other Ambulatory Visit: Payer: Self-pay

## 2018-07-14 DIAGNOSIS — Z1211 Encounter for screening for malignant neoplasm of colon: Secondary | ICD-10-CM | POA: Insufficient documentation

## 2018-07-14 DIAGNOSIS — K648 Other hemorrhoids: Secondary | ICD-10-CM

## 2018-07-14 DIAGNOSIS — D122 Benign neoplasm of ascending colon: Secondary | ICD-10-CM | POA: Diagnosis not present

## 2018-07-14 DIAGNOSIS — K439 Ventral hernia without obstruction or gangrene: Secondary | ICD-10-CM

## 2018-07-14 DIAGNOSIS — K5730 Diverticulosis of large intestine without perforation or abscess without bleeding: Secondary | ICD-10-CM | POA: Insufficient documentation

## 2018-07-14 DIAGNOSIS — Z79899 Other long term (current) drug therapy: Secondary | ICD-10-CM | POA: Diagnosis not present

## 2018-07-14 DIAGNOSIS — I10 Essential (primary) hypertension: Secondary | ICD-10-CM | POA: Diagnosis not present

## 2018-07-14 DIAGNOSIS — Z800 Family history of malignant neoplasm of digestive organs: Secondary | ICD-10-CM | POA: Diagnosis not present

## 2018-07-14 DIAGNOSIS — D125 Benign neoplasm of sigmoid colon: Secondary | ICD-10-CM | POA: Diagnosis not present

## 2018-07-14 DIAGNOSIS — D573 Sickle-cell trait: Secondary | ICD-10-CM | POA: Insufficient documentation

## 2018-07-14 DIAGNOSIS — K635 Polyp of colon: Secondary | ICD-10-CM

## 2018-07-14 DIAGNOSIS — F17210 Nicotine dependence, cigarettes, uncomplicated: Secondary | ICD-10-CM | POA: Diagnosis not present

## 2018-07-14 HISTORY — PX: COLONOSCOPY WITH PROPOFOL: SHX5780

## 2018-07-14 SURGERY — COLONOSCOPY WITH PROPOFOL
Anesthesia: General

## 2018-07-14 MED ORDER — MIDAZOLAM HCL 2 MG/2ML IJ SOLN
INTRAMUSCULAR | Status: DC | PRN
  Administered 2018-07-14: 1 mg via INTRAVENOUS

## 2018-07-14 MED ORDER — PROPOFOL 500 MG/50ML IV EMUL
INTRAVENOUS | Status: AC
  Filled 2018-07-14: qty 50

## 2018-07-14 MED ORDER — PROPOFOL 500 MG/50ML IV EMUL
INTRAVENOUS | Status: DC | PRN
  Administered 2018-07-14: 140 ug/kg/min via INTRAVENOUS

## 2018-07-14 MED ORDER — MIDAZOLAM HCL 2 MG/2ML IJ SOLN
INTRAMUSCULAR | Status: AC
  Filled 2018-07-14: qty 2

## 2018-07-14 MED ORDER — LIDOCAINE HCL (PF) 2 % IJ SOLN
INTRAMUSCULAR | Status: AC
  Filled 2018-07-14: qty 10

## 2018-07-14 MED ORDER — DEXTROSE 5 % IV SOLN
3.0000 g | INTRAVENOUS | Status: DC
  Filled 2018-07-14: qty 3000

## 2018-07-14 MED ORDER — FENTANYL CITRATE (PF) 100 MCG/2ML IJ SOLN
INTRAMUSCULAR | Status: AC
  Filled 2018-07-14: qty 2

## 2018-07-14 MED ORDER — SODIUM CHLORIDE 0.9 % IV SOLN
INTRAVENOUS | Status: DC
  Administered 2018-07-14: 10:00:00 via INTRAVENOUS

## 2018-07-14 MED ORDER — SPOT INK MARKER SYRINGE KIT
PACK | SUBMUCOSAL | Status: DC | PRN
  Administered 2018-07-14: 5 mL via SUBMUCOSAL

## 2018-07-14 MED ORDER — FENTANYL CITRATE (PF) 100 MCG/2ML IJ SOLN
INTRAMUSCULAR | Status: DC | PRN
  Administered 2018-07-14: 50 ug via INTRAVENOUS

## 2018-07-14 MED ORDER — PROPOFOL 10 MG/ML IV BOLUS
INTRAVENOUS | Status: DC | PRN
  Administered 2018-07-14 (×3): 70.1 mg via INTRAVENOUS

## 2018-07-14 NOTE — Transfer of Care (Signed)
Immediate Anesthesia Transfer of Care Note  Patient: Dawn Alvarado  Procedure(s) Performed: COLONOSCOPY WITH PROPOFOL (N/A )  Patient Location: PACU and Endoscopy Unit  Anesthesia Type:General  Level of Consciousness: sedated  Airway & Oxygen Therapy: Patient Spontanous Breathing  Post-op Assessment: Report given to RN  Post vital signs: stable  Last Vitals:  Vitals Value Taken Time  BP 102/70 07/14/2018 11:03 AM  Temp    Pulse 84 07/14/2018 11:04 AM  Resp 15 07/14/2018 11:04 AM  SpO2 99 % 07/14/2018 11:04 AM  Vitals shown include unvalidated device data.  Last Pain:  Vitals:   07/14/18 1102  TempSrc: Tympanic  PainSc: 0-No pain         Complications: No apparent anesthesia complications

## 2018-07-14 NOTE — H&P (Signed)
Vonda Antigua, MD [address] [place], [place], [place], [address] [address], [place], [place], [place], [address] Phone: [phone]  Fax: [phone]  Primary Care Physician:  Patient, No Pcp Per   Pre-Procedure History & Physical: HPI:  Dawn Alvarado is a 47 y.o. female is here for a colonoscopy.   Past Medical History:  Diagnosis Date  . Anemia   . Arthritis   . Bipolar 1 disorder ([place])   . Bronchitis   . Chronic bronchitis ([place])   . Depression   . Hypertension   . Sickle cell trait [facility])     Past Surgical History:  Procedure Laterality Date  . ABDOMINAL HYSTERECTOMY    . APPENDECTOMY    . CESAREAN SECTION      Prior to Admission medications   Medication Sig Start Date End Date Taking? Authorizing Provider  amLODipine (NORVASC) 5 MG tablet Take 1 tablet (5 mg total) by mouth daily. Patient not taking: Reported on 07/14/2018 06/21/18   Malachy Mood, MD  hydrochlorothiazide (HYDRODIURIL) 25 MG tablet Take 1 tablet (25 mg total) by mouth daily. 06/21/18   Malachy Mood, MD  oxyCODONE-acetaminophen (PERCOCET) 5-325 MG tablet Take 1 tablet by mouth every 4 (four) hours as needed for severe pain. 06/14/18   Harvest Dark, MD    Allergies as of 07/01/2018  . (No Known Allergies)    Family History  Problem Relation Age of Onset  . Cancer Father   . Prostate cancer Father   . Pancreatic cancer Father   . Cancer Maternal Aunt   . Cervical cancer Maternal Aunt     Social History   Socioeconomic History  . Marital status: Divorced    Spouse name: Not on file  . Number of children: 2  . Years of education: Not on file  . Highest education level: Not on file  Occupational History  . Not on file  Social Needs  . Financial resource strain: Not on file  . Food insecurity:    Worry: Not on file    Inability: Not on file  . Transportation needs:    Medical: Not on file    Non-medical: Not on file  Tobacco Use  . Smoking status:  Current Every Day Smoker    Packs/day: 0.50    Types: Cigarettes  . Smokeless tobacco: Never Used  Substance and Sexual Activity  . Alcohol use: No    Frequency: Never  . Drug use: No  . Sexual activity: Not Currently    Birth control/protection: None  Lifestyle  . Physical activity:    Days per week: Not on file    Minutes per session: Not on file  . Stress: Not on file  Relationships  . Social connections:    Talks on phone: Not on file    Gets together: Not on file    Attends religious service: Not on file    Active member of club or organization: Not on file    Attends meetings of clubs or organizations: Not on file    Relationship status: Not on file  . Intimate partner violence:    Fear of current or ex partner: Not on file    Emotionally abused: Not on file    Physically abused: Not on file    Forced sexual activity: Not on file  Other Topics Concern  . Not on file  Social History Narrative  . Not on file    Review of Systems: See HPI, otherwise negative ROS  Physical  Exam: BP 123/90   Pulse 75   Temp (!) 96.5 F (35.8 C) (Tympanic)   Resp 20   Ht 5\' 11"  (1.803 m)   Wt (!) 140.2 kg   LMP 10/31/2016   SpO2 100%   BMI 43.10 kg/m  General:   Alert,  pleasant and cooperative in NAD Head:  Normocephalic and atraumatic. Neck:  Supple; no masses or thyromegaly. Lungs:  Clear throughout to auscultation, normal respiratory effort.    Heart:  +S1, +S2, Regular rate and rhythm, No edema. Abdomen:  Soft, nontender and nondistended. Normal bowel sounds, without guarding, and without rebound.   Neurologic:  Alert and  oriented x4;  grossly normal neurologically.  Impression/Plan: Dawn Alvarado is here for a colonoscopy to be performed for high risk screening and intermittent BRBPR.  Risks, benefits, limitations, and alternatives regarding  colonoscopy have been reviewed with the patient.  Questions have been answered.  All parties agreeable.   Virgel Manifold, MD  07/14/2018, 10:09 AM

## 2018-07-14 NOTE — Anesthesia Post-op Follow-up Note (Signed)
Anesthesia QCDR form completed.        

## 2018-07-14 NOTE — Anesthesia Preprocedure Evaluation (Signed)
Anesthesia Evaluation  Patient identified by MRN, date of birth, ID band Patient awake    Reviewed: Allergy & Precautions, NPO status , Patient's Chart, lab work & pertinent test results  History of Anesthesia Complications Negative for: history of anesthetic complications  Airway Mallampati: III       Dental   Pulmonary neg COPD, Current Smoker,           Cardiovascular hypertension, Pt. on medications (-) Past MI and (-) CHF (-) dysrhythmias (-) Valvular Problems/Murmurs     Neuro/Psych neg Seizures Anxiety Depression Bipolar Disorder    GI/Hepatic Neg liver ROS, neg GERD  ,  Endo/Other  neg diabetesMorbid obesity  Renal/GU negative Renal ROS     Musculoskeletal   Abdominal   Peds  Hematology  (+) anemia ,   Anesthesia Other Findings   Reproductive/Obstetrics                            Anesthesia Physical Anesthesia Plan  ASA: III  Anesthesia Plan: General   Post-op Pain Management:    Induction: Intravenous  PONV Risk Score and Plan: 2 and Propofol infusion and TIVA  Airway Management Planned: Nasal Cannula  Additional Equipment:   Intra-op Plan:   Post-operative Plan:   Informed Consent: I have reviewed the patients History and Physical, chart, labs and discussed the procedure including the risks, benefits and alternatives for the proposed anesthesia with the patient or authorized representative who has indicated his/her understanding and acceptance.     Plan Discussed with:   Anesthesia Plan Comments:         Anesthesia Quick Evaluation

## 2018-07-14 NOTE — Anesthesia Postprocedure Evaluation (Signed)
Anesthesia Post Note  Patient: Dawn Alvarado  Procedure(s) Performed: COLONOSCOPY WITH PROPOFOL (N/A )  Patient location during evaluation: Endoscopy Anesthesia Type: General Level of consciousness: awake and alert Pain management: pain level controlled Vital Signs Assessment: post-procedure vital signs reviewed and stable Respiratory status: spontaneous breathing and respiratory function stable Cardiovascular status: stable Anesthetic complications: no     Last Vitals:  Vitals:   07/14/18 0952 07/14/18 1102  BP: 123/90 102/70  Pulse: 75 83  Resp: 20 15  Temp: (!) 35.8 C   SpO2: 100% 99%    Last Pain:  Vitals:   07/14/18 1102  TempSrc: Tympanic  PainSc: 0-No pain                 KEPHART,WILLIAM K

## 2018-07-15 ENCOUNTER — Telehealth: Payer: Self-pay

## 2018-07-15 ENCOUNTER — Encounter: Payer: Self-pay | Admitting: Gastroenterology

## 2018-07-15 DIAGNOSIS — R718 Other abnormality of red blood cells: Secondary | ICD-10-CM

## 2018-07-15 LAB — SURGICAL PATHOLOGY

## 2018-07-15 NOTE — Telephone Encounter (Signed)
-----   Message from Virgel Manifold, MD sent at 07/06/2018  3:46 PM EST ----- Jackelyn Poling please let patient know, her blood work showed normal iron levels.  Therefore she does not need an EGD, and only needs a colonoscopy like we discussed. However, we will refer her to hematology as her blood work showed microcytosis, which is smaller sized red blood cells, and they may work-up for things like thalassemia

## 2018-07-15 NOTE — Telephone Encounter (Signed)
LMTCO.

## 2018-07-15 NOTE — Telephone Encounter (Signed)
Pt calling back returning a call from the nurse. Pls call patient

## 2018-07-15 NOTE — Op Note (Signed)
[facility] Patient Name: Dawn Alvarado Procedure Date: 07/14/2018 10:19 AM MRN: [id number] Account #: [id number] Date of Birth: August 07, 1971 Admit Type: Outpatient Age: 47 Room: [facility] ENDO ROOM 4 Gender: Female Note Status: Finalized Procedure:            Colonoscopy Indications:          Screening in patient at increased risk: Family history                        of 1st-degree relative with colorectal cancer before                        age 10 years Providers:            Anwyn Kriegel B. Bonna Gains MD, MD Referring MD:         Forest Gleason Md, MD (Referring MD) Medicines:            Monitored Anesthesia Care Complications:        No immediate complications. Procedure:            Pre-Anesthesia Assessment:                       - ASA Grade Assessment: II - A patient with mild                        systemic disease.                       - Prior to the procedure, a History and Physical was                        performed, and patient medications, allergies and                        sensitivities were reviewed. The patient's tolerance of                        previous anesthesia was reviewed.                       - The risks and benefits of the procedure and the                        sedation options and risks were discussed with the                        patient. All questions were answered and informed                        consent was obtained.                       - Patient identification and proposed procedure were                        verified prior to the procedure by the physician, the                        nurse, the anesthesiologist, the anesthetist and the  technician. The procedure was verified in the procedure                        room.                       After obtaining informed consent, the colonoscope was                        passed under direct vision. Throughout the procedure,   the patient's blood pressure, pulse, and oxygen                        saturations were monitored continuously. The                        Colonoscope was introduced through the anus and                        advanced to the the cecum, identified by appendiceal                        orifice and ileocecal valve. The colonoscopy was                        performed with ease. The patient tolerated the                        procedure well. The quality of the bowel preparation                        was good. Findings:      The perianal and digital rectal examinations were normal.      A 12 mm polyp was found in the ascending colon. The polyp was       semi-pedunculated. The polyp was removed with a hot snare. Resection and       retrieval were complete. To prevent bleeding after the polypectomy, one       hemostatic clip was successfully placed. There was no bleeding at the       end of the procedure. Area was tattooed with an injection of Spot       (carbon black).      A 3 mm polyp was found in the ascending colon. The polyp was sessile.       The polyp was removed with a cold snare. Resection and retrieval were       complete.      A 7 mm polyp was found in the sigmoid colon. The polyp was       semi-pedunculated. The polyp was removed with a hot snare. Resection and       retrieval were complete.      A 4 mm polyp was found in the sigmoid colon. The polyp was sessile. The       polyp was removed with a cold biopsy forceps. Resection and retrieval       were complete.      Multiple small-mouthed diverticula were found in the sigmoid colon.      The exam was otherwise without abnormality.      The rectum, sigmoid colon, descending colon, transverse colon, ascending       colon and cecum appeared normal.  Anal papilla(e) were hypertrophied.      Non-bleeding internal hemorrhoids were found during retroflexion. Impression:           - One 12 mm polyp in the ascending colon, removed  with                        a hot snare. Resected and retrieved. Clip was placed.                        Tattooed.                       - One 3 mm polyp in the ascending colon, removed with a                        cold snare. Resected and retrieved.                       - One 7 mm polyp in the sigmoid colon, removed with a                        hot snare. Resected and retrieved.                       - One 4 mm polyp in the sigmoid colon, removed with a                        cold biopsy forceps. Resected and retrieved.                       - Diverticulosis in the sigmoid colon.                       - The examination was otherwise normal.                       - The rectum, sigmoid colon, descending colon,                        transverse colon, ascending colon and cecum are normal.                       - Non-bleeding internal hemorrhoids. Recommendation:       - Discharge patient to home (with escort).                       - Advance diet as tolerated.                       - Continue present medications.                       - Await pathology results.                       - Repeat colonoscopy date to be determined after                        pending pathology results are reviewed.                       - The findings  and recommendations were discussed with                        the patient.                       - The findings and recommendations were discussed with                        the patient's family.                       - Return to primary care physician as previously                        scheduled.                       - Patient reported intermittent minimal BRBPR at home                        and this is likely a result of her internal hemorrhoids.                       - High fiber diet. Procedure Code(s):    --- Professional ---                       [id number], Colonoscopy, flexible; with removal of tumor(s),                        polyp(s), or other  lesion(s) by snare technique                       45381, Colonoscopy, flexible; with directed submucosal                        injection(s), any substance                       12458, 7, Colonoscopy, flexible; with biopsy, single                        or multiple Diagnosis Code(s):    --- Professional ---                       Z80.0, Family history of malignant neoplasm of                        digestive organs                       D12.2, Benign neoplasm of ascending colon                       D12.5, Benign neoplasm of sigmoid colon                       K64.8, Other hemorrhoids CPT copyright 2018 American [employer] Association. All rights reserved. The codes documented in this report are preliminary and upon coder review may  be revised to meet current compliance requirements.  Vonda Antigua, MD Margretta Sidle B. Bonna Gains MD, MD 07/14/2018 11:12:41 AM This report has been signed electronically. Number of Addenda: 0  Note Initiated On: 07/14/2018 10:19 AM Scope Withdrawal Time: 0 hours 26 minutes 34 seconds  Total Procedure Duration: 0 hours 32 minutes 21 seconds  Estimated Blood Loss: Estimated blood loss: none.      [facility]

## 2018-07-16 NOTE — Telephone Encounter (Signed)
Pt notified of results and no EGD necessary but needs to be referred to hematology for thalassemia.

## 2018-07-20 ENCOUNTER — Encounter: Payer: Self-pay | Admitting: Gastroenterology

## 2018-07-20 ENCOUNTER — Encounter: Payer: Self-pay | Admitting: Surgery

## 2018-07-20 ENCOUNTER — Ambulatory Visit (INDEPENDENT_AMBULATORY_CARE_PROVIDER_SITE_OTHER): Payer: BLUE CROSS/BLUE SHIELD | Admitting: Surgery

## 2018-07-20 ENCOUNTER — Other Ambulatory Visit: Payer: Self-pay

## 2018-07-20 VITALS — BP 135/93 | HR 73 | Temp 97.7°F | Resp 18 | Ht 71.0 in | Wt 311.0 lb

## 2018-07-20 DIAGNOSIS — K439 Ventral hernia without obstruction or gangrene: Secondary | ICD-10-CM

## 2018-07-20 NOTE — H&P (View-Only) (Signed)
Surgical Clinic Progress/Follow-up Note   HPI:  47 y.o. Female presents to clinic for follow-up evaluation of her large infra-umbilical hernia. Since her prior (first) appointment (06/24/2018), patient has underwent recommended and overdue colonoscopy with endoscopic resection of multiple non-malignant polyps. She reports her hernia remains bothersome, though she has continued to smoke 1/2 ppd and has not lost any weight, adding that she has been too busy to make an appointment with medical bariatric specialist for which she requested contact information/referral and, after discussing with her children, she says she is not interested in considering bariatric surgery, expresses understanding regarding increased risk of hernia recurrence with obesity and increased risks with ongoing tobacco abuse (smoking). She otherwise denies any fever/chills, CP, or SOB.  Review of Systems:  Constitutional: denies any other weight loss, fever, chills, or sweats  Eyes: denies any other vision changes, history of eye injury  ENT: denies sore throat, hearing problems  Respiratory: denies shortness of breath, wheezing  Cardiovascular: denies chest pain, palpitations  Gastrointestinal: abdominal pain as per HPI, denies constipation, diarrhea, or N/V Musculoskeletal: denies any other joint pains or cramps  Skin: Denies any other rashes or skin discolorations  Neurological: denies any other headache, dizziness, weakness  Psychiatric: denies any other depression, anxiety  All other review of systems: otherwise negative   Vital Signs:  BP (!) 135/93   Pulse 73   Temp 97.7 F (36.5 C)   Resp 18   Ht 5\' 11"  (1.803 m)   Wt (!) 311 lb (141.1 kg)   LMP 10/31/2016   SpO2 98%   BMI 43.38 kg/m    Physical Exam:  Constitutional:  -- Obese body habitus  -- Awake, alert, and oriented x3  Eyes:  -- Pupils equally round and reactive to light  -- No scleral icterus  Ear, nose, throat:  -- No jugular venous  distension  -- No nasal drainage, bleeding Pulmonary:  -- No crackles -- Equal breath sounds bilaterally -- Breathing non-labored at rest Cardiovascular:  -- S1, S2 present  -- No pericardial rubs  Gastrointestinal:  -- Soft non-distended, no guarding/rebound tenderness to palpation -- No abdominal masses appreciated, pulsatile or otherwise, except mildly tender to palpation large reducible infra-umbilical ventral hernia Musculoskeletal / Integumentary:  -- Wounds or skin discoloration: None appreciated  -- Extremities: B/L UE and LE FROM, hands and feet warm  Neurologic:  -- Motor function: intact and symmetric  -- Sensation: intact and symmetric  Colonoscopy (Tahiliani, 07/14/2018):  - One 12 mm polyp in the ascending colon, removed with a hot snare. Resected and retrieved. Clip was placed. Tattooed. - One 3 mm polyp in the ascending colon, removed with a cold snare. Resected and retrieved. - One 7 mm polyp in the sigmoid colon, removed with a hot snare. Resected and retrieved. - One 4 mm polyp in the sigmoid colon, removed with a cold biopsy forceps. Resected and retrieved. - Diverticulosis in the sigmoid colon. - The examination was otherwise normal. - The rectum, sigmoid colon, descending colon, transverse colon, ascending colon and cecum are normal. - Non-bleeding internal hemorrhoids.  Colonoscopic Pathology (07/14/2018): A. COLON POLYP X2, ASCENDING; HOT AND COLD SNARE:  - TUBULAR ADENOMA, 1.1 CM; CAUTERIZED INKED BASE FREE OF DYSPLASIA.  - SMALL SEPARATE TUBULAR ADENOMA.  - NEGATIVE FOR HIGH-GRADE DYSPLASIA AND MALIGNANCY.   B. COLON POLYP X2, SIGMOID; HOT SNARE AND COLD BIOPSY:  - HYPERPLASTIC POLYP (MULTIPLE FRAGMENTS).  - SMALL FRAGMENT OF TUBULOVILLOUS ADENOMA.  - NEGATIVE FOR HIGH-GRADE DYSPLASIA AND MALIGNANCY.  Assessment:  47 y.o. yo Female with a problem list including...  Patient Active Problem List   Diagnosis Date Noted  . Family history of malignant  neoplasm of gastrointestinal tract   . Polyp of sigmoid colon   . Internal hemorrhoids   . Benign neoplasm of ascending colon   . Anxiety 07/01/2018  . Bipolar 1 disorder ([place]) 07/01/2018  . Chronic bronchitis ([place]) 07/01/2018  . Depression 07/01/2018  . Iron deficiency anemia 07/01/2018  . Sickle cell trait ([place]) 07/01/2018  . Abnormal uterine bleeding (AUB) 01/01/2017  . Smoking trying to quit 09/11/2016  . Ventral hernia without obstruction or gangrene 09/11/2016  . Primary osteoarthritis of left knee 11/27/2015  . Allergic rhinitis 11/15/2015  . Bilateral chronic knee pain 11/15/2015  . Essential hypertension 11/15/2015  . Menorrhagia with regular cycle 11/15/2015  . Subserous leiomyoma of uterus 11/15/2015  . History of hypokalemia 05/09/2014  . Microcytic hypochromic anemia 05/09/2014  . Body mass index (BMI) of 40.0-44.9 in adult ([place]) 05/09/2014  . Tobacco abuse 05/09/2014    presents to clinic for follow-up evaluation of her large infra-umbilical ventral hernia, complicated by comorbidities including morbid obesity (BMI 44), HTN, chronic bronchitis, chronic constipation, sickle cell trait, osteoarthritis, and bipolar disorder. .  - results of recent colonoscopy and biopsies discussed             - discussed with patient signs and symptoms of hernia incarceration and obstruction             - strategies for manual self-reduction of patient's infra-umbilical hernia also reviewed and discussed  - maintain hydration with high fiber heart healthy diet to reduce/minimize constipation +/- daily stool softener as needed             - all risks, benefits, and alternatives to laparoscopic repair of her large increasingly symptomatic infra-umbilical hernia with mesh were discussed with the patient, all of her questions were answered to patient's expressed satisfaction, patient expresses she very much wishes to proceed, and informed consent was obtained.             - will plan for  laparoscopic repair of large increasingly symptomatic easily reducible infra-umbilical ventral hernia with mesh pending anesthesia and OR availability             - anticipate return to clinic 2 weeks after above planned surgery  - will plan for post-surgical velcro abdominal binder             - instructed to call if any questions or concerns  All of the above recommendations were discussed with the patient, and all of patient's questions were answered to her expressed satisfaction.  -- Marilynne Drivers Rosana Hoes, MD, [place]: [facility] - Partnering for exceptional care. Office: [phone]

## 2018-07-20 NOTE — Progress Notes (Signed)
Surgical Clinic Progress/Follow-up Note   HPI:  47 y.o. Female presents to clinic for follow-up evaluation of her large infra-umbilical hernia. Since her prior (first) appointment (06/24/2018), patient has underwent recommended and overdue colonoscopy with endoscopic resection of multiple non-malignant polyps. She reports her hernia remains bothersome, though she has continued to smoke 1/2 ppd and has not lost any weight, adding that she has been too busy to make an appointment with medical bariatric specialist for which she requested contact information/referral and, after discussing with her children, she says she is not interested in considering bariatric surgery, expresses understanding regarding increased risk of hernia recurrence with obesity and increased risks with ongoing tobacco abuse (smoking). She otherwise denies any fever/chills, CP, or SOB.  Review of Systems:  Constitutional: denies any other weight loss, fever, chills, or sweats  Eyes: denies any other vision changes, history of eye injury  ENT: denies sore throat, hearing problems  Respiratory: denies shortness of breath, wheezing  Cardiovascular: denies chest pain, palpitations  Gastrointestinal: abdominal pain as per HPI, denies constipation, diarrhea, or N/V Musculoskeletal: denies any other joint pains or cramps  Skin: Denies any other rashes or skin discolorations  Neurological: denies any other headache, dizziness, weakness  Psychiatric: denies any other depression, anxiety  All other review of systems: otherwise negative   Vital Signs:  BP (!) 135/93   Pulse 73   Temp 97.7 F (36.5 C)   Resp 18   Ht 5\' 11"  (1.803 m)   Wt (!) 311 lb (141.1 kg)   LMP 10/31/2016   SpO2 98%   BMI 43.38 kg/m    Physical Exam:  Constitutional:  -- Obese body habitus  -- Awake, alert, and oriented x3  Eyes:  -- Pupils equally round and reactive to light  -- No scleral icterus  Ear, nose, throat:  -- No jugular venous  distension  -- No nasal drainage, bleeding Pulmonary:  -- No crackles -- Equal breath sounds bilaterally -- Breathing non-labored at rest Cardiovascular:  -- S1, S2 present  -- No pericardial rubs  Gastrointestinal:  -- Soft non-distended, no guarding/rebound tenderness to palpation -- No abdominal masses appreciated, pulsatile or otherwise, except mildly tender to palpation large reducible infra-umbilical ventral hernia Musculoskeletal / Integumentary:  -- Wounds or skin discoloration: None appreciated  -- Extremities: B/L UE and LE FROM, hands and feet warm  Neurologic:  -- Motor function: intact and symmetric  -- Sensation: intact and symmetric  Colonoscopy (Tahiliani, 07/14/2018):  - One 12 mm polyp in the ascending colon, removed with a hot snare. Resected and retrieved. Clip was placed. Tattooed. - One 3 mm polyp in the ascending colon, removed with a cold snare. Resected and retrieved. - One 7 mm polyp in the sigmoid colon, removed with a hot snare. Resected and retrieved. - One 4 mm polyp in the sigmoid colon, removed with a cold biopsy forceps. Resected and retrieved. - Diverticulosis in the sigmoid colon. - The examination was otherwise normal. - The rectum, sigmoid colon, descending colon, transverse colon, ascending colon and cecum are normal. - Non-bleeding internal hemorrhoids.  Colonoscopic Pathology (07/14/2018): A. COLON POLYP X2, ASCENDING; HOT AND COLD SNARE:  - TUBULAR ADENOMA, 1.1 CM; CAUTERIZED INKED BASE FREE OF DYSPLASIA.  - SMALL SEPARATE TUBULAR ADENOMA.  - NEGATIVE FOR HIGH-GRADE DYSPLASIA AND MALIGNANCY.   B. COLON POLYP X2, SIGMOID; HOT SNARE AND COLD BIOPSY:  - HYPERPLASTIC POLYP (MULTIPLE FRAGMENTS).  - SMALL FRAGMENT OF TUBULOVILLOUS ADENOMA.  - NEGATIVE FOR HIGH-GRADE DYSPLASIA AND MALIGNANCY.  Assessment:  47 y.o. yo Female with a problem list including...  Patient Active Problem List   Diagnosis Date Noted  . Family history of malignant  neoplasm of gastrointestinal tract   . Polyp of sigmoid colon   . Internal hemorrhoids   . Benign neoplasm of ascending colon   . Anxiety 07/01/2018  . Bipolar 1 disorder ([place]) 07/01/2018  . Chronic bronchitis ([place]) 07/01/2018  . Depression 07/01/2018  . Iron deficiency anemia 07/01/2018  . Sickle cell trait ([place]) 07/01/2018  . Abnormal uterine bleeding (AUB) 01/01/2017  . Smoking trying to quit 09/11/2016  . Ventral hernia without obstruction or gangrene 09/11/2016  . Primary osteoarthritis of left knee 11/27/2015  . Allergic rhinitis 11/15/2015  . Bilateral chronic knee pain 11/15/2015  . Essential hypertension 11/15/2015  . Menorrhagia with regular cycle 11/15/2015  . Subserous leiomyoma of uterus 11/15/2015  . History of hypokalemia 05/09/2014  . Microcytic hypochromic anemia 05/09/2014  . Body mass index (BMI) of 40.0-44.9 in adult ([place]) 05/09/2014  . Tobacco abuse 05/09/2014    presents to clinic for follow-up evaluation of her large infra-umbilical ventral hernia, complicated by comorbidities including morbid obesity (BMI 44), HTN, chronic bronchitis, chronic constipation, sickle cell trait, osteoarthritis, and bipolar disorder. .  - results of recent colonoscopy and biopsies discussed             - discussed with patient signs and symptoms of hernia incarceration and obstruction             - strategies for manual self-reduction of patient's infra-umbilical hernia also reviewed and discussed  - maintain hydration with high fiber heart healthy diet to reduce/minimize constipation +/- daily stool softener as needed             - all risks, benefits, and alternatives to laparoscopic repair of her large increasingly symptomatic infra-umbilical hernia with mesh were discussed with the patient, all of her questions were answered to patient's expressed satisfaction, patient expresses she very much wishes to proceed, and informed consent was obtained.             - will plan for  laparoscopic repair of large increasingly symptomatic easily reducible infra-umbilical ventral hernia with mesh pending anesthesia and OR availability             - anticipate return to clinic 2 weeks after above planned surgery  - will plan for post-surgical velcro abdominal binder             - instructed to call if any questions or concerns  All of the above recommendations were discussed with the patient, and all of patient's questions were answered to her expressed satisfaction.  -- Marilynne Drivers Rosana Hoes, MD, [place]: [facility] - Partnering for exceptional care. Office: [phone]

## 2018-07-20 NOTE — Patient Instructions (Addendum)
Patient scheduled for surgery with Dr.Davis for hernia repair at [facility] on 08/02/18. She will pre admit at the hospital on 07/26/18 at 11:00 am. The patient is aware of dates, time, and instructions.  Please wear abdominal binder after surgery.   Call the office with any questions or concerns.   Ventral Hernia A ventral hernia is a bulge of tissue from inside the abdomen that pushes through a weak area of the muscles that form the front wall of the abdomen. The tissues inside the abdomen are inside a sac (peritoneum). These tissues include the small intestine, large intestine, and the fatty tissue that covers the intestines (omentum). Sometimes, the bulge that forms a hernia contains intestines. Other hernias contain only fat. Ventral hernias do not go away without surgical treatment. There are several types of ventral hernias. You may have:  A hernia at an incision site from previous abdominal surgery (incisional hernia).  A hernia just above the belly button (epigastric hernia), or at the belly button (umbilical hernia). These types of hernias can develop from heavy lifting or straining.  A hernia that comes and goes (reducible hernia). It may be visible only when you lift or strain. This type of hernia can be pushed back into the abdomen (reduced).  A hernia that traps abdominal tissue inside the hernia (incarcerated hernia). This type of hernia does not reduce.  A hernia that cuts off blood flow to the tissues inside the hernia (strangulated hernia). The tissues can start to die if this happens. This is a very painful bulge that cannot be reduced. A strangulated hernia is a medical emergency.  What are the causes? This condition is caused by abdominal tissue putting pressure on an area of weakness in the abdominal muscles. What increases the risk? The following factors may make you more likely to develop this condition:  Being female.  Being 33 or older.  Being overweight or  obese.  Having had previous abdominal surgery, especially if there was an infection after surgery.  Having had an injury to the abdominal wall.  Having had several pregnancies.  Having a buildup of fluid inside the abdomen (ascites).  What are the signs or symptoms? The only symptom of a ventral hernia may be a painless bulge in the abdomen. A reducible hernia may be visible only when you strain, cough, or lift. Other symptoms may include:  Dull pain.  A feeling of pressure.  Signs and symptoms of a strangulated hernia may include:  Increasing pain.  Nausea and vomiting.  Pain when pressing on the hernia.  The skin over the hernia turning red or purple.  Constipation.  Blood in the stool (feces).  How is this diagnosed? This condition may be diagnosed based on:  Your symptoms.  Your medical history.  A physical exam. You may be asked to cough or strain while standing. These actions increase the pressure inside your abdomen and force the hernia through the opening in your muscles. Your health care provider may try to reduce the hernia by pressing on it.  Imaging studies, such as an ultrasound or CT scan.  How is this treated? This condition is treated with surgery. If you have a strangulated hernia, surgery is done as soon as possible. If your hernia is small and not incarcerated, you may be asked to lose some weight before surgery. Follow these instructions at home:  Follow instructions from your health care provider about eating or drinking restrictions.  If you are overweight, your health care  provider may recommend that you increase your activity level and eat a healthier diet.  Do not lift anything that is heavier than 10 lb (4.5 kg).  Return to your normal activities as told by your health care provider. Ask your health care provider what activities are safe for you. You may need to avoid activities that increase pressure on your hernia.  Take  over-the-counter and prescription medicines only as told by your health care provider.  Keep all follow-up visits as told by your health care provider. This is important. Contact a health care provider if:  Your hernia gets larger.  Your hernia becomes painful. Get help right away if:  Your hernia becomes increasingly painful.  You have pain along with any of the following: ? Changes in skin color in the area of the hernia. ? Nausea. ? Vomiting. ? Fever. Summary  A ventral hernia is a bulge of tissue from inside the abdomen that pushes through a weak area of the muscles that form the front wall of the abdomen.  This condition is treated with surgery, which may be urgent depending on your hernia.  Do not lift anything that is heavier than 10 lb (4.5 kg), and follow activity instructions from your health care provider. This information is not intended to replace advice given to you by your health care provider. Make sure you discuss any questions you have with your health care provider. Document Released: 07/14/2012 Document Revised: 03/14/2016 Document Reviewed: 03/14/2016 Elsevier Interactive Patient Education  [employer].

## 2018-07-25 NOTE — Progress Notes (Addendum)
[place]  Telephone:([phone]) [phone] Fax:(336) [phone]  ID: Dawn Alvarado OB: Jul 08, 1971  MR#: [id number]  VOH#:[id number]  Patient Care Team: Patient, No Pcp Per as PCP - General (General Practice)  CHIEF COMPLAINT: Microcytosis  INTERVAL HISTORY: Patient is a 47 year old female who was noted to have a persistent microcytosis without anemia and normal iron stores.  She currently feels well and is asymptomatic.  She has no neurologic complaints.  She denies any recent fevers or illnesses.  She has a good appetite and denies weight loss.  She has no chest pain or shortness of breath.  She denies any nausea, vomiting, constipation, or diarrhea.  She has no melena or hematochezia.  She has no urinary complaints.  Patient feels at her baseline offers no specific complaints today.  REVIEW OF SYSTEMS:   Review of Systems  Constitutional: Negative.  Negative for fever, malaise/fatigue and weight loss.  Respiratory: Negative.  Negative for cough, hemoptysis and shortness of breath.   Cardiovascular: Negative.  Negative for chest pain and leg swelling.  Gastrointestinal: Negative.  Negative for abdominal pain, blood in stool and melena.  Genitourinary: Negative.  Negative for hematuria.  Musculoskeletal: Negative.  Negative for back pain.  Skin: Negative.  Negative for rash.  Neurological: Negative.  Negative for focal weakness, weakness and headaches.  Psychiatric/Behavioral: Negative.  The patient is not nervous/anxious.     As per HPI. Otherwise, a complete review of systems is negative.  PAST MEDICAL HISTORY: Past Medical History:  Diagnosis Date  . Anemia   . Arthritis   . Bipolar 1 disorder ([place])   . Bronchitis   . Chronic bronchitis ([place])   . Depression   . Hypertension   . Sickle cell trait ([place])     PAST SURGICAL HISTORY: Past Surgical History:  Procedure Laterality Date  . ABDOMINAL HYSTERECTOMY    . APPENDECTOMY    . CESAREAN SECTION    .  COLONOSCOPY WITH PROPOFOL N/A 07/14/2018   Procedure: COLONOSCOPY WITH PROPOFOL;  Surgeon: Virgel Manifold, MD;  Location: [facility] ENDOSCOPY;  Service: Endoscopy;  Laterality: N/A;    FAMILY HISTORY: Family History  Problem Relation Age of Onset  . Cancer Father   . Prostate cancer Father   . Pancreatic cancer Father   . Cancer Maternal Aunt   . Cervical cancer Maternal Aunt     ADVANCED DIRECTIVES (Y/N):  N  HEALTH MAINTENANCE: Social History   Tobacco Use  . Smoking status: Current Every Day Smoker    Packs/day: 0.50    Types: Cigarettes  . Smokeless tobacco: Never Used  Substance Use Topics  . Alcohol use: No    Frequency: Never  . Drug use: No     Colonoscopy:  PAP:  Bone density:  Lipid panel:  No Known Allergies  Current Outpatient Medications  Medication Sig Dispense Refill  . amLODipine (NORVASC) 5 MG tablet Take 1 tablet (5 mg total) by mouth daily. 30 tablet 3  . hydrochlorothiazide (HYDRODIURIL) 25 MG tablet Take 1 tablet (25 mg total) by mouth daily. 30 tablet 2   No current facility-administered medications for this visit.     OBJECTIVE: Vitals:   07/26/18 1326  BP: (!) 146/108  Pulse: 73  Temp: (!) 97.3 F (36.3 C)     Body mass index is 44.88 kg/m.    ECOG FS:0 - Asymptomatic  General: Well-developed, well-nourished, no acute distress. Eyes: Pink conjunctiva, anicteric sclera. HEENT: Normocephalic, moist mucous membranes, clear oropharnyx. Lungs: Clear to auscultation bilaterally.  Heart: Regular rate and rhythm. No rubs, murmurs, or gallops. Abdomen: Soft, nontender, nondistended. No organomegaly noted, normoactive bowel sounds. Musculoskeletal: No edema, cyanosis, or clubbing. Neuro: Alert, answering all questions appropriately. Cranial nerves grossly intact. Skin: No rashes or petechiae noted. Psych: Normal affect. Lymphatics: No cervical, calvicular, axillary or inguinal LAD.   LAB RESULTS:  Lab Results  Component Value Date    NA 137 07/26/2018   K 3.8 07/26/2018   CL 106 07/26/2018   CO2 25 07/26/2018   GLUCOSE 92 07/26/2018   BUN 9 07/26/2018   CREATININE 0.63 07/26/2018   CALCIUM 9.6 07/26/2018   PROT 7.1 06/14/2018   ALBUMIN 3.9 06/14/2018   AST 19 06/14/2018   ALT 20 06/14/2018   ALKPHOS 89 06/14/2018   BILITOT 0.7 06/14/2018   GFRNONAA >60 07/26/2018   GFRAA >60 07/26/2018    Lab Results  Component Value Date   WBC 7.1 07/26/2018   NEUTROABS 3.8 07/26/2018   HGB 14.7 07/26/2018   HCT 41.7 07/26/2018   MCV 76.7 (L) 07/26/2018   PLT 220 07/26/2018   Lab Results  Component Value Date   IRON 82 07/05/2018   TIBC 307 07/05/2018   IRONPCTSAT 27 07/05/2018   Lab Results  Component Value Date   FERRITIN 32 07/05/2018    STUDIES: No results found.  ASSESSMENT: Microcytosis.  PLAN:    1.  Microcytosis: Patient has a normal hemoglobin as well as normal iron stores, but has a persistently decreased MCV of approximately 77.  Patient has a history of sickle cell trait which is likely contributing to her microcytosis.  Will order hemoglobinopathy profile for confirmation.  Will also assess for possible underlying thalassemia.  No intervention is needed at this time.  Patient does not require oral or IV iron supplementation.  No follow-up is needed.  Please refer patient back if there are any questions or concerns.  I spent a total of 45 minutes face-to-face with the patient of which greater than 50% of the visit was spent in counseling and coordination of care as detailed above.  Patient expressed understanding and was in agreement with this plan. She also understands that She can call clinic at any time with any questions, concerns, or complaints.    Lloyd Huger, MD   07/28/2018 6:42 AM  Addendum: Patient's hemoglobin electrophoresis is not consistent with sickle cell trait but rather heterozygous for hemoglobin C trait.  Patient may have been misdiagnosed at an earlier date.  It is  likely contributing to her persistent microcytosis and is clinically insignificant.   Lloyd Huger, MD 08/01/18 9:02 AM

## 2018-07-26 ENCOUNTER — Inpatient Hospital Stay: Payer: BLUE CROSS/BLUE SHIELD | Attending: Oncology | Admitting: Oncology

## 2018-07-26 ENCOUNTER — Other Ambulatory Visit: Payer: Self-pay

## 2018-07-26 ENCOUNTER — Encounter
Admission: RE | Admit: 2018-07-26 | Discharge: 2018-07-26 | Disposition: A | Discharge status: Home / Self Care | Payer: BLUE CROSS/BLUE SHIELD | Source: Ambulatory Visit | Attending: Surgery | Admitting: Surgery

## 2018-07-26 ENCOUNTER — Inpatient Hospital Stay: Payer: BLUE CROSS/BLUE SHIELD

## 2018-07-26 VITALS — BP 146/108 | HR 73 | Temp 97.3°F | Ht 70.0 in | Wt 312.8 lb

## 2018-07-26 DIAGNOSIS — R718 Other abnormality of red blood cells: Secondary | ICD-10-CM | POA: Diagnosis present

## 2018-07-26 DIAGNOSIS — Z01812 Encounter for preprocedural laboratory examination: Secondary | ICD-10-CM | POA: Diagnosis present

## 2018-07-26 DIAGNOSIS — F17210 Nicotine dependence, cigarettes, uncomplicated: Secondary | ICD-10-CM | POA: Insufficient documentation

## 2018-07-26 DIAGNOSIS — Z79899 Other long term (current) drug therapy: Secondary | ICD-10-CM | POA: Insufficient documentation

## 2018-07-26 DIAGNOSIS — I10 Essential (primary) hypertension: Secondary | ICD-10-CM | POA: Insufficient documentation

## 2018-07-26 LAB — CBC WITH DIFFERENTIAL/PLATELET
Abs Immature Granulocytes: 0.02 10*3/uL (ref 0.00–0.07)
Basophils Absolute: 0.1 10*3/uL (ref 0.0–0.1)
Basophils Relative: 1 %
Eosinophils Absolute: 0.1 10*3/uL (ref 0.0–0.5)
Eosinophils Relative: 1 %
HCT: 41.7 % (ref 36.0–46.0)
Hemoglobin: 14.7 g/dL (ref 12.0–15.0)
Immature Granulocytes: 0 %
LYMPHS PCT: 34 %
Lymphs Abs: 2.4 10*3/uL (ref 0.7–4.0)
MCH: 27 pg (ref 26.0–34.0)
MCHC: 35.3 g/dL (ref 30.0–36.0)
MCV: 76.7 fL — ABNORMAL LOW (ref 80.0–100.0)
Monocytes Absolute: 0.7 10*3/uL (ref 0.1–1.0)
Monocytes Relative: 10 %
NEUTROS ABS: 3.8 10*3/uL (ref 1.7–7.7)
Neutrophils Relative %: 54 %
Platelets: 220 10*3/uL (ref 150–400)
RBC: 5.44 MIL/uL — ABNORMAL HIGH (ref 3.87–5.11)
RDW: 13.9 % (ref 11.5–15.5)
WBC: 7.1 10*3/uL (ref 4.0–10.5)
nRBC: 0 % (ref 0.0–0.2)

## 2018-07-26 LAB — BASIC METABOLIC PANEL
ANION GAP: 6 (ref 5–15)
BUN: 9 mg/dL (ref 6–20)
CO2: 25 mmol/L (ref 22–32)
Calcium: 9.6 mg/dL (ref 8.9–10.3)
Chloride: 106 mmol/L (ref 98–111)
Creatinine, Ser: 0.63 mg/dL (ref 0.44–1.00)
GFR calc Af Amer: >60 mL/min (ref >60)
GFR calc non Af Amer: >60 mL/min (ref >60)
Glucose, Bld: 92 mg/dL (ref 70–99)
Potassium: 3.8 mmol/L (ref 3.5–5.1)
Sodium: 137 mmol/L (ref 135–145)

## 2018-07-26 NOTE — Progress Notes (Signed)
Patient is here today to establish care for Mycrocytosis. Patient stated that she had been doing well.

## 2018-07-26 NOTE — Pre-Procedure Instructions (Signed)
Reviewed case w/ Dr. Andree Elk (anesthesiology) re: BMI 45, preliminary EKG NSR, no additional workup/clearance needed.

## 2018-07-26 NOTE — Patient Instructions (Addendum)
  Your procedure is scheduled on: Monday August 02, 2018 Report to Same Day Surgery 2nd floor [facility] Entrance-take elevator on left to 2nd floor.  Check in with surgery information desk.) To find out your arrival time, call [phone] 1:00-3:00 PM on Friday July 30, 2018  Remember: Instructions that are not followed completely may result in serious medical risk, up to and including death, or upon the discretion of your surgeon and anesthesiologist your surgery may need to be rescheduled.    __x__ 1. Do not eat food (including mints, candies, chewing gum) after midnight the night before your procedure. You may drink clear liquids up to 2 hours before you are scheduled to arrive at the hospital for your procedure.  Do not drink anything within 2 hours of your scheduled arrival to the hospital.  Approved clear liquids:  --Water or Apple juice without pulp  --Clear carbohydrate beverage such as Gatorade or Powerade  --Black Coffee or Clear Tea (No milk, no creamers, do not add anything to the coffee or tea)    __x__ 2. No Alcohol for 24 hours before or after surgery.   __x__ 3. No Smoking or e-cigarettes for 24 hours before surgery.  Do not use any chewable tobacco products for at least 6 hours before surgery.   __x__ 4. Notify your doctor if there is any change in your medical condition (cold, fever, infections).   __x__ 5. On the morning of surgery brush your teeth with toothpaste and water.  You may rinse your mouth with mouthwash if you wish.  Do not swallow any toothpaste or mouthwash.  Please read over the following fact sheets that you were given:   [facility] Preparing for Surgery and/or MRSA Information    __x__ Use CHG Soap or Sage wipes as directed on instruction sheet   Do not wear jewelry, make-up, hairpins, clips or nail polish on the day of surgery.  Do not wear lotions, powders, deodorant, or perfumes.   Do not shave below the face/neck 48 hours  prior to surgery.   Do not bring valuables to the hospital.    [facility] is not responsible for any belongings or valuables.               Contacts, dentures or bridgework may not be worn into surgery.  For patients discharged on the day of surgery, you will NOT be permitted to drive yourself home.  You must have a responsible adult with you for 24 hours after surgery.  __x__ Take these medications on the morning of surgery with a small sip of water:  1. Amlodipine/Norvasc  (No Hydrochlorothiazide on the morning of surgery)  __x__ Follow recommendations from Cardiologist, Pulmonologist or PCP regarding stopping Aspirin, Coumadin, Plavix, Eliquis, Effient, Pradaxa, and Pletal.  __x__  Avoid Anti-inflammatories such as Advil, Ibuprofen, Motrin, Aleve, Naproxen, Naprosyn, BC/Goodies powders or aspirin products. You may continue to take Tylenol and Celebrex.   __x__  Avoid supplements until after surgery. You may continue to take Vitamin D, Vitamin B, and multivitamin.

## 2018-07-28 DIAGNOSIS — R718 Other abnormality of red blood cells: Secondary | ICD-10-CM | POA: Insufficient documentation

## 2018-07-28 LAB — HEMOGLOBINOPATHY EVALUATION
HGB C: 36.5 % — AB
Hgb A2 Quant: 3.1 % (ref 1.8–3.2)
Hgb A: 60.4 % — ABNORMAL LOW (ref 96.4–98.8)
Hgb F Quant: 0 % (ref 0.0–2.0)
Hgb S Quant: 0 %
Hgb Variant: 0 %

## 2018-08-02 ENCOUNTER — Encounter
Admission: RE | Disposition: A | Discharge status: Home / Self Care | Payer: Self-pay | Source: Home / Self Care | Attending: Surgery

## 2018-08-02 ENCOUNTER — Other Ambulatory Visit: Payer: Self-pay

## 2018-08-02 ENCOUNTER — Encounter: Payer: Self-pay | Admitting: *Deleted

## 2018-08-02 ENCOUNTER — Ambulatory Visit: Payer: BLUE CROSS/BLUE SHIELD | Admitting: Certified Registered Nurse Anesthetist

## 2018-08-02 ENCOUNTER — Ambulatory Visit
Admission: RE | Admit: 2018-08-02 | Discharge: 2018-08-02 | Disposition: A | Discharge status: Home / Self Care | Payer: BLUE CROSS/BLUE SHIELD | Attending: Surgery | Admitting: Surgery

## 2018-08-02 DIAGNOSIS — D573 Sickle-cell trait: Secondary | ICD-10-CM | POA: Diagnosis not present

## 2018-08-02 DIAGNOSIS — I10 Essential (primary) hypertension: Secondary | ICD-10-CM | POA: Insufficient documentation

## 2018-08-02 DIAGNOSIS — F17210 Nicotine dependence, cigarettes, uncomplicated: Secondary | ICD-10-CM | POA: Insufficient documentation

## 2018-08-02 DIAGNOSIS — Z6841 Body Mass Index (BMI) 40.0 and over, adult: Secondary | ICD-10-CM | POA: Diagnosis not present

## 2018-08-02 DIAGNOSIS — Z79899 Other long term (current) drug therapy: Secondary | ICD-10-CM | POA: Diagnosis not present

## 2018-08-02 DIAGNOSIS — K432 Incisional hernia without obstruction or gangrene: Secondary | ICD-10-CM

## 2018-08-02 DIAGNOSIS — K439 Ventral hernia without obstruction or gangrene: Secondary | ICD-10-CM

## 2018-08-02 HISTORY — PX: UMBILICAL HERNIA REPAIR: SHX196

## 2018-08-02 LAB — ALPHA-THALASSEMIA GENOTYPR

## 2018-08-02 SURGERY — REPAIR, HERNIA, UMBILICAL, LAPAROSCOPIC
Anesthesia: General

## 2018-08-02 MED ORDER — ACETAMINOPHEN 500 MG PO TABS
1000.0000 mg | ORAL_TABLET | ORAL | Status: AC
  Administered 2018-08-02: 1000 mg via ORAL

## 2018-08-02 MED ORDER — EPHEDRINE SULFATE 50 MG/ML IJ SOLN
INTRAMUSCULAR | Status: AC
  Filled 2018-08-02: qty 1

## 2018-08-02 MED ORDER — LIDOCAINE HCL (PF) 1 % IJ SOLN
INTRAMUSCULAR | Status: AC
  Filled 2018-08-02: qty 30

## 2018-08-02 MED ORDER — FAMOTIDINE 20 MG PO TABS
ORAL_TABLET | ORAL | Status: AC
  Administered 2018-08-02: 20 mg via ORAL
  Filled 2018-08-02: qty 1

## 2018-08-02 MED ORDER — ONDANSETRON HCL 4 MG/2ML IJ SOLN
INTRAMUSCULAR | Status: DC | PRN
  Administered 2018-08-02: 4 mg via INTRAVENOUS

## 2018-08-02 MED ORDER — FENTANYL CITRATE (PF) 100 MCG/2ML IJ SOLN
INTRAMUSCULAR | Status: AC
  Filled 2018-08-02: qty 2

## 2018-08-02 MED ORDER — LIDOCAINE HCL (CARDIAC) PF 100 MG/5ML IV SOSY
PREFILLED_SYRINGE | INTRAVENOUS | Status: DC | PRN
  Administered 2018-08-02 (×2): 100 mg via INTRATRACHEAL

## 2018-08-02 MED ORDER — LIDOCAINE HCL (PF) 2 % IJ SOLN
INTRAMUSCULAR | Status: AC
  Filled 2018-08-02: qty 10

## 2018-08-02 MED ORDER — GABAPENTIN 300 MG PO CAPS
300.0000 mg | ORAL_CAPSULE | ORAL | Status: AC
  Administered 2018-08-02: 300 mg via ORAL

## 2018-08-02 MED ORDER — CHLORHEXIDINE GLUCONATE CLOTH 2 % EX PADS: 6.0000 | MEDICATED_PAD | Freq: Once | CUTANEOUS | Status: DC

## 2018-08-02 MED ORDER — DEXAMETHASONE SODIUM PHOSPHATE 10 MG/ML IJ SOLN
INTRAMUSCULAR | Status: AC
  Filled 2018-08-02: qty 1

## 2018-08-02 MED ORDER — SUCCINYLCHOLINE CHLORIDE 20 MG/ML IJ SOLN
INTRAMUSCULAR | Status: DC | PRN
  Administered 2018-08-02: 120 mg via INTRAVENOUS

## 2018-08-02 MED ORDER — DEXTROSE 5 % IV SOLN
INTRAVENOUS | Status: DC | PRN
  Administered 2018-08-02: 3 g via INTRAVENOUS

## 2018-08-02 MED ORDER — OXYCODONE-ACETAMINOPHEN 5-325 MG PO TABS
ORAL_TABLET | ORAL | Status: AC
  Administered 2018-08-02: 1 via ORAL
  Filled 2018-08-02: qty 1

## 2018-08-02 MED ORDER — PROMETHAZINE HCL 25 MG/ML IJ SOLN: 6.2500 mg | INTRAMUSCULAR | Status: DC | PRN

## 2018-08-02 MED ORDER — OXYCODONE-ACETAMINOPHEN 5-325 MG PO TABS: 1.0000 | ORAL_TABLET | ORAL | 0 refills | Status: DC | PRN

## 2018-08-02 MED ORDER — FENTANYL CITRATE (PF) 100 MCG/2ML IJ SOLN
INTRAMUSCULAR | Status: DC | PRN
  Administered 2018-08-02: 100 ug via INTRAVENOUS
  Administered 2018-08-02 (×2): 50 ug via INTRAVENOUS

## 2018-08-02 MED ORDER — FENTANYL CITRATE (PF) 100 MCG/2ML IJ SOLN
25.0000 ug | INTRAMUSCULAR | Status: DC | PRN
  Administered 2018-08-02 (×2): 25 ug via INTRAVENOUS

## 2018-08-02 MED ORDER — LABETALOL HCL 5 MG/ML IV SOLN
INTRAVENOUS | Status: AC
  Filled 2018-08-02: qty 4

## 2018-08-02 MED ORDER — LABETALOL HCL 5 MG/ML IV SOLN
INTRAVENOUS | Status: DC | PRN
  Administered 2018-08-02 (×2): 5 mg via INTRAVENOUS

## 2018-08-02 MED ORDER — PROPOFOL 10 MG/ML IV BOLUS
INTRAVENOUS | Status: DC | PRN
  Administered 2018-08-02: 200 mg via INTRAVENOUS
  Administered 2018-08-02: 50 mg via INTRAVENOUS

## 2018-08-02 MED ORDER — NEOSTIGMINE METHYLSULFATE 10 MG/10ML IV SOLN
INTRAVENOUS | Status: DC | PRN
  Administered 2018-08-02: 2.5 mg via INTRAVENOUS

## 2018-08-02 MED ORDER — ROCURONIUM BROMIDE 50 MG/5ML IV SOLN
INTRAVENOUS | Status: AC
  Filled 2018-08-02: qty 1

## 2018-08-02 MED ORDER — GABAPENTIN 300 MG PO CAPS
ORAL_CAPSULE | ORAL | Status: AC
  Administered 2018-08-02: 300 mg via ORAL
  Filled 2018-08-02: qty 1

## 2018-08-02 MED ORDER — MIDAZOLAM HCL 2 MG/2ML IJ SOLN
INTRAMUSCULAR | Status: AC
  Filled 2018-08-02: qty 2

## 2018-08-02 MED ORDER — LACTATED RINGERS IV SOLN
INTRAVENOUS | Status: DC
  Administered 2018-08-02 (×2): via INTRAVENOUS

## 2018-08-02 MED ORDER — PROPOFOL 10 MG/ML IV BOLUS
INTRAVENOUS | Status: AC
  Filled 2018-08-02: qty 20

## 2018-08-02 MED ORDER — LIDOCAINE HCL 1 % IJ SOLN
INTRAMUSCULAR | Status: DC | PRN
  Administered 2018-08-02: 20 mL

## 2018-08-02 MED ORDER — FAMOTIDINE 20 MG PO TABS
20.0000 mg | ORAL_TABLET | Freq: Once | ORAL | Status: AC
  Administered 2018-08-02: 20 mg via ORAL

## 2018-08-02 MED ORDER — ONDANSETRON HCL 4 MG/2ML IJ SOLN
INTRAMUSCULAR | Status: AC
  Filled 2018-08-02: qty 2

## 2018-08-02 MED ORDER — GLYCOPYRROLATE 0.2 MG/ML IJ SOLN
INTRAMUSCULAR | Status: DC | PRN
  Administered 2018-08-02: 0.4 mg via INTRAVENOUS

## 2018-08-02 MED ORDER — OXYCODONE-ACETAMINOPHEN 5-325 MG PO TABS
1.0000 | ORAL_TABLET | ORAL | Status: DC | PRN
  Administered 2018-08-02: 1 via ORAL

## 2018-08-02 MED ORDER — BUPIVACAINE HCL (PF) 0.5 % IJ SOLN
INTRAMUSCULAR | Status: AC
  Filled 2018-08-02: qty 30

## 2018-08-02 MED ORDER — ACETAMINOPHEN 500 MG PO TABS
ORAL_TABLET | ORAL | Status: AC
  Administered 2018-08-02: 1000 mg via ORAL
  Filled 2018-08-02: qty 2

## 2018-08-02 MED ORDER — ROCURONIUM BROMIDE 100 MG/10ML IV SOLN
INTRAVENOUS | Status: DC | PRN
  Administered 2018-08-02: 5 mg via INTRAVENOUS
  Administered 2018-08-02: 10 mg via INTRAVENOUS
  Administered 2018-08-02: 45 mg via INTRAVENOUS
  Administered 2018-08-02 (×2): 10 mg via INTRAVENOUS

## 2018-08-02 MED ORDER — DEXAMETHASONE SODIUM PHOSPHATE 10 MG/ML IJ SOLN
INTRAMUSCULAR | Status: DC | PRN
  Administered 2018-08-02: 6 mg via INTRAVENOUS

## 2018-08-02 SURGICAL SUPPLY — 45 items
BLADE SURG SZ11 CARB STEEL (BLADE) ×3 IMPLANT
CANISTER SUCT 1200ML W/VALVE (MISCELLANEOUS) ×3 IMPLANT
CHLORAPREP W/TINT 26ML (MISCELLANEOUS) ×3 IMPLANT
COVER WAND RF STERILE (DRAPES) ×1 IMPLANT
DERMABOND ADVANCED (GAUZE/BANDAGES/DRESSINGS) ×4
DERMABOND ADVANCED .7 DNX12 (GAUZE/BANDAGES/DRESSINGS) IMPLANT
DEVICE SECURE STRAP 25 ABSORB (INSTRUMENTS) ×4 IMPLANT
ELECT REM PT RETURN 9FT ADLT (ELECTROSURGICAL) ×3
ELECTRODE REM PT RTRN 9FT ADLT (ELECTROSURGICAL) ×1 IMPLANT
ETHIBOND 2 0 GREEN CT 2 30IN (SUTURE) ×8 IMPLANT
GLOVE BIO SURGEON STRL SZ7 (GLOVE) ×5 IMPLANT
GLOVE INDICATOR 7.5 STRL GRN (GLOVE) ×5 IMPLANT
GOWN STRL REUS W/ TWL LRG LVL3 (GOWN DISPOSABLE) ×1 IMPLANT
GOWN STRL REUS W/ TWL XL LVL3 (GOWN DISPOSABLE) ×1 IMPLANT
GOWN STRL REUS W/TWL LRG LVL3 (GOWN DISPOSABLE) ×4
GOWN STRL REUS W/TWL XL LVL3 (GOWN DISPOSABLE)
GRASPER SUT TROCAR 14GX15 (MISCELLANEOUS) ×3 IMPLANT
IRRIGATION STRYKERFLOW (MISCELLANEOUS) ×1 IMPLANT
IRRIGATOR STRYKERFLOW (MISCELLANEOUS)
IV NS 1000ML (IV SOLUTION)
IV NS 1000ML BAXH (IV SOLUTION) ×1 IMPLANT
KIT TURNOVER KIT A (KITS) ×3 IMPLANT
LABEL OR SOLS (LABEL) ×3 IMPLANT
MESH VENTRALIGHT ST 6X8 (Mesh Specialty) ×2 IMPLANT
MESH VENTRLGHT ELLIPSE 8X6XMFL (Mesh Specialty) IMPLANT
NEEDLE HYPO 22GX1.5 SAFETY (NEEDLE) ×3 IMPLANT
NEEDLE VERESS 14GA 120MM (NEEDLE) ×3 IMPLANT
PACK LAP CHOLECYSTECTOMY (MISCELLANEOUS) ×3 IMPLANT
SCISSORS METZENBAUM CVD 33 (INSTRUMENTS) ×2 IMPLANT
SHEARS HARMONIC ACE PLUS 36CM (ENDOMECHANICALS) ×2 IMPLANT
SLEEVE ENDOPATH XCEL 5M (ENDOMECHANICALS) ×5 IMPLANT
SUT MNCRL 4-0 (SUTURE) ×2
SUT MNCRL 4-0 27XMFL (SUTURE) ×1
SUT SILK 2 0 (SUTURE) ×2
SUT SILK 2-0 30XBRD TIE 12 (SUTURE) IMPLANT
SUT VIC AB 3-0 SH 27 (SUTURE) ×2
SUT VIC AB 3-0 SH 27X BRD (SUTURE) ×1 IMPLANT
SUT VICRYL 0 AB UR-6 (SUTURE) ×3 IMPLANT
SUTURE MNCRL 4-0 27XMF (SUTURE) ×1 IMPLANT
SYR 10ML LL (SYRINGE) ×2 IMPLANT
TACKER 5MM HERNIA 3.5CML NAB (ENDOMECHANICALS) ×1 IMPLANT
TRAY FOLEY MTR SLVR 16FR STAT (SET/KITS/TRAYS/PACK) ×2 IMPLANT
TROCAR XCEL 12X100 BLDLESS (ENDOMECHANICALS) ×3 IMPLANT
TROCAR XCEL NON-BLD 5MMX100MML (ENDOMECHANICALS) ×3 IMPLANT
TUBING INSUFFLATION (TUBING) ×3 IMPLANT

## 2018-08-02 NOTE — Anesthesia Procedure Notes (Signed)
Procedure Name: Intubation Date/Time: 08/02/2018 7:49 AM Performed by: Babs Sciara, CRNA Pre-anesthesia Checklist: Patient identified, Emergency Drugs available, Suction available, Patient being monitored and Timeout performed Patient Re-evaluated:Patient Re-evaluated prior to induction Oxygen Delivery Method: Circle system utilized Preoxygenation: Pre-oxygenation with 100% oxygen Induction Type: IV induction Ventilation: Mask ventilation without difficulty and Oral airway inserted - appropriate to patient size Laryngoscope Size: McGraph and 4 Grade View: Grade I Tube type: Oral Tube size: 7.5 mm Number of attempts: 1 Airway Equipment and Method: Stylet Placement Confirmation: positive ETCO2 and breath sounds checked- equal and bilateral Secured at: 22 (lip) cm Tube secured with: Tape Dental Injury: Injury to lip  Comments: Slight pressure mark on lower left lip-no blood. Attempted DL with mac 3, no visualization. Used McGraph 4 on 1st attempt.

## 2018-08-02 NOTE — Anesthesia Preprocedure Evaluation (Signed)
Anesthesia Evaluation  Patient identified by MRN, date of birth, ID band Patient awake    Reviewed: Allergy & Precautions, NPO status , Patient's Chart, lab work & pertinent test results  History of Anesthesia Complications Negative for: history of anesthetic complications  Airway Mallampati: III       Dental  (+) Dental Advidsory Given   Pulmonary neg shortness of breath, neg COPD, Current Smoker,           Cardiovascular hypertension, Pt. on medications (-) angina(-) Past MI and (-) CHF (-) dysrhythmias (-) Valvular Problems/Murmurs     Neuro/Psych neg Seizures PSYCHIATRIC DISORDERS Anxiety Depression Bipolar Disorder    GI/Hepatic Neg liver ROS, neg GERD  ,  Endo/Other  neg diabetesMorbid obesity  Renal/GU negative Renal ROS     Musculoskeletal   Abdominal   Peds  Hematology  (+) Blood dyscrasia, Sickle cell trait and anemia ,   Anesthesia Other Findings Past Medical History: No date: Anemia No date: Arthritis No date: Bipolar 1 disorder ([facility]) No date: Bronchitis No date: Chronic bronchitis ([facility]) No date: Depression No date: Hypertension No date: Sickle cell trait ([facility])   Reproductive/Obstetrics                             Anesthesia Physical  Anesthesia Plan  ASA: III  Anesthesia Plan: General   Post-op Pain Management:    Induction: Intravenous  PONV Risk Score and Plan: 2 and Ondansetron, Dexamethasone, Treatment may vary due to age or medical condition, Promethazine and Midazolam  Airway Management Planned: Oral ETT  Additional Equipment:   Intra-op Plan:   Post-operative Plan:   Informed Consent: I have reviewed the patients History and Physical, chart, labs and discussed the procedure including the risks, benefits and alternatives for the proposed anesthesia with the patient or authorized representative who has indicated his/her understanding and acceptance.      Plan Discussed with:   Anesthesia Plan Comments:         Anesthesia Quick Evaluation

## 2018-08-02 NOTE — Transfer of Care (Signed)
Immediate Anesthesia Transfer of Care Note  Patient: Dawn Alvarado  Procedure(s) Performed: LAPAROSCOPIC  INTRA-UMBILICAL HERNIA WITH MESH (N/A )  Patient Location: PACU  Anesthesia Type:General  Level of Consciousness: awake, alert  and oriented  Airway & Oxygen Therapy: Patient Spontanous Breathing and Patient connected to face mask oxygen  Post-op Assessment: Report given to RN and Post -op Vital signs reviewed and stable  Post vital signs: Reviewed and stable  Last Vitals:  Vitals Value Taken Time  BP 123/88 08/02/2018 11:56 AM  Temp 36.5 C 08/02/2018 11:56 AM  Pulse 73 08/02/2018 12:01 PM  Resp 17 08/02/2018 12:01 PM  SpO2 100 % 08/02/2018 12:01 PM  Vitals shown include unvalidated device data.  Last Pain:  Vitals:   08/02/18 0611  TempSrc: Temporal  PainSc: 0-No pain         Complications: No apparent anesthesia complications

## 2018-08-02 NOTE — Discharge Instructions (Addendum)
In addition to included general post-operative instructions for Laparoscopic Incisional Hernia Repair,  Diet: Gradually resume home heart healthy diet.   Activity: No heavy lifting >15 - 20 pounds (children, pets, laundry, garbage) or strenuous activity until follow-up, but light activity and walking are encouraged. Do not drive or drink alcohol if taking narcotic pain medications.  Wound care: Apply abdominal binder in the morning before activities, and remove when laying down to sleep. 2 days after surgery (Wednesday, 12/25), you may shower incision wet with soapy water and pat dry (do not rub incisions), but no baths or submerging incision underwater until follow-up.   Medications: Resume all home medications. For mild to moderate pain: acetaminophen (Tylenol) or ibuprofen/naproxen (if no kidney disease). Combining Tylenol with alcohol can substantially increase your risk of causing liver disease. Narcotic pain medications, if prescribed, can be used for severe pain, though may cause nausea, constipation, and drowsiness. Do not combine Tylenol and Percocet (or similar) within a 6 hour period as Percocet (and similar) contain(s) Tylenol. If you do not need the narcotic pain medication, you do not need to fill the prescription.  Call office [phone]) at any time if any questions, worsening pain, fevers/chills, bleeding, drainage from incision site, or other concerns.  AMBULATORY SURGERY  DISCHARGE INSTRUCTIONS   1) The drugs that you were given will stay in your system until tomorrow so for the next 24 hours you should not:  A) Drive an automobile B) Make any legal decisions C) Drink any alcoholic beverage   2) You may resume regular meals tomorrow.  Today it is better to start with liquids and gradually work up to solid foods.  You may eat anything you prefer, but it is better to start with liquids, then soup and crackers, and gradually work up to solid foods.   3) Please notify  your doctor immediately if you have any unusual bleeding, trouble breathing, redness and pain at the surgery site, drainage, fever, or pain not relieved by medication.    4) Additional Instructions:        Please contact your physician with any problems or Same Day Surgery at [phone], Monday through Friday 6 am to 4 pm, or [employer] at [facility] number at [phone].

## 2018-08-02 NOTE — Interval H&P Note (Signed)
History and Physical Interval Note:  08/02/2018 7:24 AM  Dawn Alvarado  has presented today for surgery, with the diagnosis of INTRA-UMBILICAL HERNIA  The various methods of treatment have been discussed with the patient and family. After consideration of risks, benefits and other options for treatment, the patient has consented to  Procedure(s): LAPAROSCOPIC  INTRA-UMBILICAL HERNIA WITH MESH (N/A) as a surgical intervention .  The patient's history has been reviewed, patient examined, no change in status, stable for surgery.  I have reviewed the patient's chart and labs.  Questions were answered to the patient's satisfaction.     Vickie Epley

## 2018-08-02 NOTE — Anesthesia Post-op Follow-up Note (Signed)
Anesthesia QCDR form completed.        

## 2018-08-02 NOTE — Op Note (Signed)
SURGICAL OPERATIVE REPORT   DATE OF PROCEDURE: 08/02/2018  ATTENDING Surgeon(s): Vickie Epley, MD  ANESTHESIA: GETA  PRE-OPERATIVE DIAGNOSIS: Reducible 7 cm (tall) x 5 cm (wide) lower midline incisional hernia (icd-10's: K43.2)   POST-OPERATIVE DIAGNOSIS: Reducible 7 cm (tall) x 5 cm (wide) lower midline incisional hernia (icd-10: K43.2)   PROCEDURE(S): (cpt: 16109) 1.) Laparoscopic repair of large 7 cm (tall) x 5 cm (wide) lower midline incisional hernia with mesh   INTRAOPERATIVE FINDINGS: 7 cm (tall) x 5 cm (wide) somewhat reducible lower midline incisional hernia containing omentum, several loops of densely adherent small intestine and large peritoneal hernia sac at the time of laparoscopic surgery   INTRAVENOUS FLUIDS: 1300 mL crystalloid    ESTIMATED BLOOD LOSS: Minimal (<20 mL)   URINE OUTPUT: No Foley   SPECIMENS: No Specimen   IMPLANTS: Bard 15.2 cm x 20.3 cm Ventralight ST single-side coated mesh   DRAINS: none   COMPLICATIONS: None apparent   CONDITION AT END OF PROCEDURE: Hemodynamically stable and extubated   DISPOSITION OF PATIENT: PACU   INDICATIONS FOR PROCEDURE:  Patient is a 47 y.o. female who previously presented with a large increasingly symptomatic reducible incisional hernia x >10 years for which she previously sought evaluation and care, but never previously underwent repair. Following overdue colonoscopy, all risks, benefits, and alternatives to laparoscopic repair with mesh were discussed with the patient, all of patient's questions were answered to her expressed satisfaction, and informed consent was accordingly obtained and documented.   DETAILS OF PROCEDURE: Patient was brought to the operating suite and appropriately identified. General anesthesia was administered along with appropriate pre-operative antibiotics, and endotracheal intubation was performed by anesthetist. In supine position, operative site was prepped and draped in the usual  sterile fashion, and following a brief time out, local anesthetic was injected subcutaneously over Palmer's point 3 cm inferior to the subcostal margin along the mid-clavicular line, at which point a 5 mm incision was made using a #11 blade scalpel. Fascia was elevated, and a Verress needle was inserted, and its proper position was confirmed using aspiration and saline meniscus test.   Upon insufflation of the abdominal cavity with carbon dioxide to a well-tolerated pressure of 12-15 mmHg, a 5 mm LUQ port followed by a 5 mm 30-degree laparoscope were inserted and used to inspect the abdominal cavity and its contents with no injuries from insertion of the first trochar noted and confirmation of pre-operative diagnosis. An additional 12 mm trocar (through which to insert the hernia repair mesh) was inserted along the Left side of the abdomen and utilized to perform extensive lysis of adhesions >60 minutes in order just to be able to place remaining 5 mm LLQ 5 mm port. The hernia contents were then carefully laparoscopically reduced with some difficulty due to multiple loops of densely adherent small intestine and large peritoneal hernia sac, but without resection of incarcerated omentum or hernia sac. Spinal needle was then used to percutaneously identify superior, inferior, and two lateral sites, which were each marked for placement of lateral anchoring sutures with at least 2 - 3 cm mesh extension beyond the the margins of the hernia defect. Appropriately sized mesh was selected, the inward-facing coated side of the mesh was marked to designate 4 quadrants, and a 0-0 Ethibond braided non-absorbable suture was secured at least 1 cm from each of 4 corners to serve as trans-fascial anchoring sutures. The mesh was rolled and inserted through the 12 mm port into the abdominal cavity, where it  was unrolled, re-oriented, and kept with coated side down.   PMI laparoscopic fascial closure device was advanced through 2 mm  incisions made at each of 4 markings made to signify the hernia repair mesh edges (at least 1 cm away from Ethibond sutures placed at least 1 cm from the edge of the mesh) and used to externalize the anchoring sutures, which were each tied at one time after all had been externalized and upon confirmation of enough tension on the mesh in a trampoline-like fashion. SecureStrap laparoscopic tacking device was then used to circumferentially secure the edges of the mesh. One additional 5 mm Right-sided port was inserted under direct laparoscopic visualization to facilitate circumferential tacking. Hemostasis and secure placement of hernia repair mesh were confirmed, and intra-peritoneal cavity was inspected with no additional findings.   PMI laparoscopic fascial closure device was then used with 0-0 Vicryl suture to re-approximate fascia at the 12 mm port site. All ports were then removed under direct visualization, and the abdominal cavity was desuflated. All port sites were irrigated/cleaned, additional local anesthetic was injected at each incision, 3-0 Vicryl was used to re-approximate dermis at 12 mm port site, and subcuticular 4-0 Monocryl suture was used to re-approximate skin. Skin was then cleaned, dried, and sterile skin glue was applied. Patient was then safely able to be extubated, awakened, and transferred to PACU for post-operative monitoring and care.   I was present for all aspects of the above procedure, and no operative complications were apparent.

## 2018-08-03 ENCOUNTER — Encounter: Payer: Self-pay | Admitting: Surgery

## 2018-08-05 ENCOUNTER — Ambulatory Visit: Payer: BLUE CROSS/BLUE SHIELD | Admitting: Internal Medicine

## 2018-08-05 DIAGNOSIS — Z0289 Encounter for other administrative examinations: Secondary | ICD-10-CM

## 2018-08-06 ENCOUNTER — Ambulatory Visit: Payer: BLUE CROSS/BLUE SHIELD | Admitting: Obstetrics and Gynecology

## 2018-08-06 ENCOUNTER — Other Ambulatory Visit: Payer: BLUE CROSS/BLUE SHIELD

## 2018-08-06 NOTE — Anesthesia Postprocedure Evaluation (Signed)
Anesthesia Post Note  Patient: Tully Burgo  Procedure(s) Performed: LAPAROSCOPIC  INTRA-UMBILICAL HERNIA WITH MESH (N/A )  Patient location during evaluation: PACU Anesthesia Type: General Level of consciousness: awake and alert Pain management: pain level controlled Vital Signs Assessment: post-procedure vital signs reviewed and stable Respiratory status: spontaneous breathing, nonlabored ventilation, respiratory function stable and patient connected to nasal cannula oxygen Cardiovascular status: blood pressure returned to baseline and stable Postop Assessment: no apparent nausea or vomiting Anesthetic complications: no     Last Vitals:  Vitals:   08/02/18 1350 08/02/18 1441  BP: 99/64 109/63  Pulse: 69 74  Resp: 14 14  Temp: (!) 36.2 C   SpO2: 97% 98%    Last Pain:  Vitals:   08/03/18 1009  TempSrc:   PainSc: 10-Worst pain ever                 Martha Clan

## 2018-08-09 DIAGNOSIS — K432 Incisional hernia without obstruction or gangrene: Secondary | ICD-10-CM

## 2018-08-17 ENCOUNTER — Ambulatory Visit: Payer: BLUE CROSS/BLUE SHIELD | Admitting: Surgery

## 2018-09-27 ENCOUNTER — Telehealth: Payer: Self-pay

## 2018-09-27 NOTE — Telephone Encounter (Signed)
Pharmacy faxed over a refill request on Amlodipine 5mg .   Please advise

## 2018-09-28 ENCOUNTER — Telehealth: Payer: Self-pay | Admitting: *Deleted

## 2018-09-28 NOTE — Telephone Encounter (Signed)
Pt aware.
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
## 2018-09-28 NOTE — Telephone Encounter (Signed)
Has to establish with PCP I only saw her once for ER follow up and she has not followed up since

## 2019-02-08 ENCOUNTER — Emergency Department: Payer: BLUE CROSS/BLUE SHIELD

## 2019-02-08 ENCOUNTER — Emergency Department
Admission: EM | Admit: 2019-02-08 | Discharge: 2019-02-08 | Disposition: A | Discharge status: Home / Self Care | Payer: BLUE CROSS/BLUE SHIELD | Attending: Emergency Medicine | Admitting: Emergency Medicine

## 2019-02-08 ENCOUNTER — Other Ambulatory Visit: Payer: Self-pay

## 2019-02-08 ENCOUNTER — Encounter: Payer: Self-pay | Admitting: Emergency Medicine

## 2019-02-08 DIAGNOSIS — S39012A Strain of muscle, fascia and tendon of lower back, initial encounter: Secondary | ICD-10-CM | POA: Insufficient documentation

## 2019-02-08 DIAGNOSIS — Y998 Other external cause status: Secondary | ICD-10-CM | POA: Insufficient documentation

## 2019-02-08 DIAGNOSIS — Y92410 Unspecified street and highway as the place of occurrence of the external cause: Secondary | ICD-10-CM | POA: Diagnosis not present

## 2019-02-08 DIAGNOSIS — Z79899 Other long term (current) drug therapy: Secondary | ICD-10-CM | POA: Diagnosis not present

## 2019-02-08 DIAGNOSIS — R51 Headache: Secondary | ICD-10-CM | POA: Insufficient documentation

## 2019-02-08 DIAGNOSIS — S199XXA Unspecified injury of neck, initial encounter: Secondary | ICD-10-CM | POA: Diagnosis present

## 2019-02-08 DIAGNOSIS — I10 Essential (primary) hypertension: Secondary | ICD-10-CM | POA: Insufficient documentation

## 2019-02-08 DIAGNOSIS — S20212A Contusion of left front wall of thorax, initial encounter: Secondary | ICD-10-CM | POA: Diagnosis not present

## 2019-02-08 DIAGNOSIS — S161XXA Strain of muscle, fascia and tendon at neck level, initial encounter: Secondary | ICD-10-CM | POA: Diagnosis not present

## 2019-02-08 DIAGNOSIS — Y9389 Activity, other specified: Secondary | ICD-10-CM | POA: Diagnosis not present

## 2019-02-08 DIAGNOSIS — F17210 Nicotine dependence, cigarettes, uncomplicated: Secondary | ICD-10-CM | POA: Diagnosis not present

## 2019-02-08 MED ORDER — HYDROCODONE-ACETAMINOPHEN 5-325 MG PO TABS
1.0000 | ORAL_TABLET | Freq: Once | ORAL | Status: AC
  Administered 2019-02-08: 1 via ORAL
  Filled 2019-02-08: qty 1

## 2019-02-08 MED ORDER — ORPHENADRINE CITRATE 30 MG/ML IJ SOLN
60.0000 mg | Freq: Once | INTRAMUSCULAR | Status: AC
  Administered 2019-02-08: 60 mg via INTRAMUSCULAR
  Filled 2019-02-08: qty 2

## 2019-02-08 MED ORDER — METHOCARBAMOL 500 MG PO TABS: 500.0000 mg | ORAL_TABLET | Freq: Four times a day (QID) | ORAL | 0 refills | Status: DC

## 2019-02-08 MED ORDER — MELOXICAM 15 MG PO TABS: 15.0000 mg | ORAL_TABLET | Freq: Every day | ORAL | 0 refills | Status: DC

## 2019-02-08 MED ORDER — KETOROLAC TROMETHAMINE 30 MG/ML IJ SOLN
60.0000 mg | Freq: Once | INTRAMUSCULAR | Status: AC
  Administered 2019-02-08: 60 mg via INTRAMUSCULAR
  Filled 2019-02-08: qty 2
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
## 2019-03-27 IMAGING — US US PELVIS COMPLETE
1 series · 14 of 25 positions shown · non-contrast
Comparison: 06/14/2018 CT

CLINICAL DATA: 47-year-old female with pelvic pain and RIGHT
ovarian cyst identified on recent CT. History of hysterectomy.

EXAM:
TRANSABDOMINAL AND TRANSVAGINAL ULTRASOUND OF PELVIS
TECHNIQUE: Both transabdominal and transvaginal ultrasound examinations of the
pelvis were performed. Transabdominal technique was performed for
global imaging of the pelvis including uterus, ovaries, adnexal
regions, and pelvic cul-de-sac. It was necessary to proceed with
endovaginal exam following the transabdominal exam to visualize the
ovaries and adnexal regions.

[Series 1: us pelvis complete · 0.21mm/px · 14 of 66 slices shown]
[im 1/66]
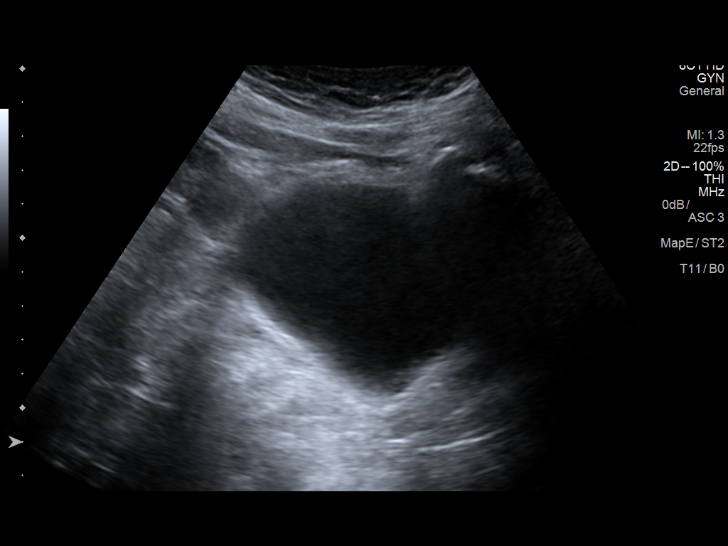
[im 6/66]
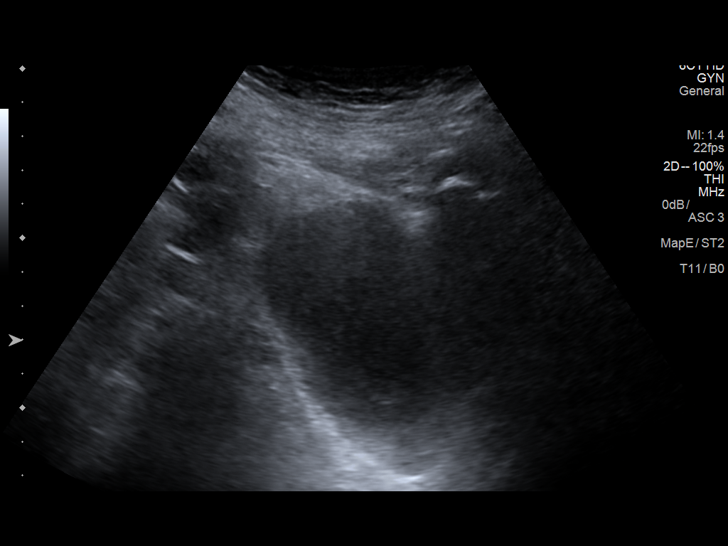
[im 11/66]
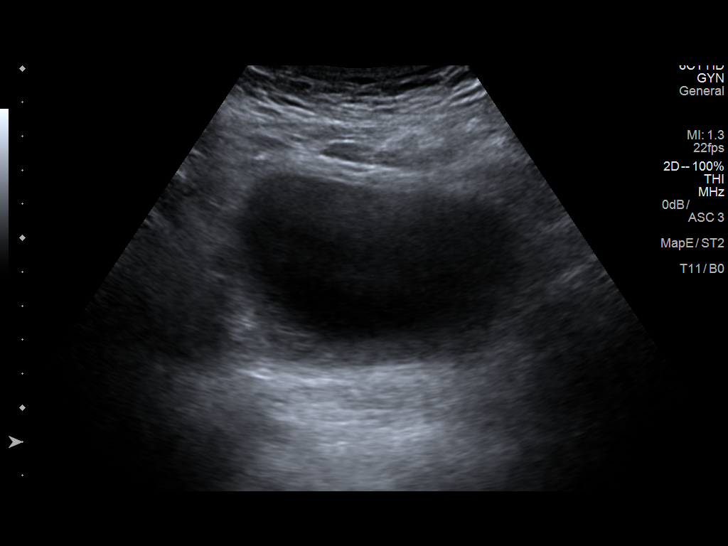
[im 17/66]
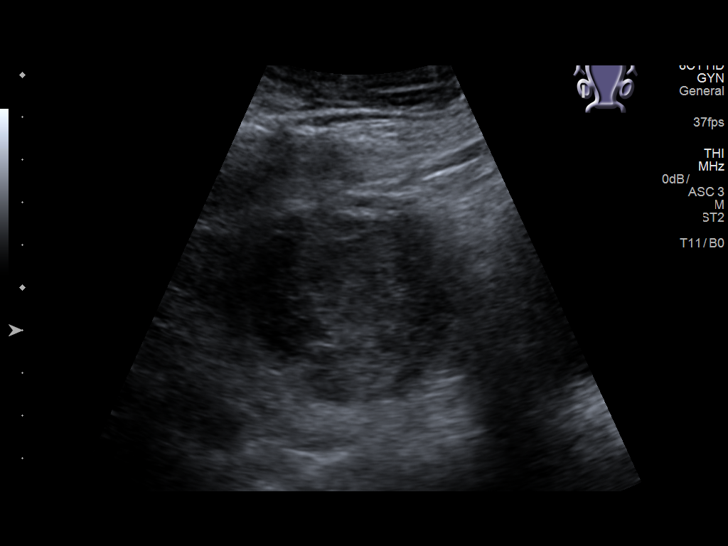
[im 22/66]
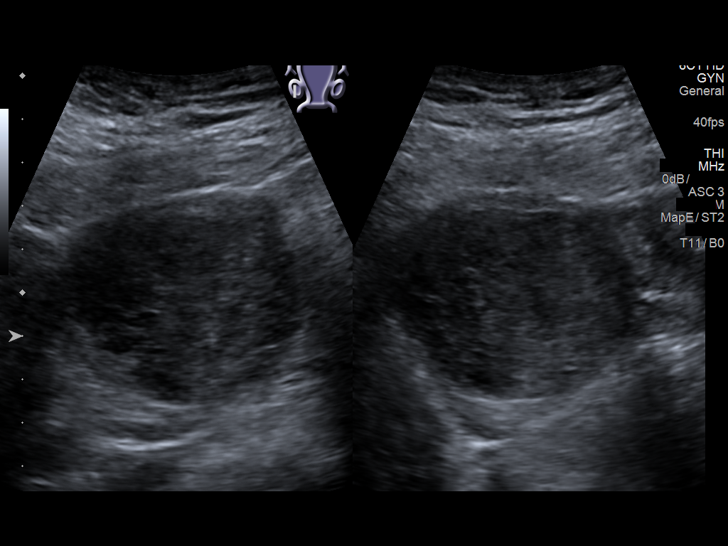
[im 25/66]
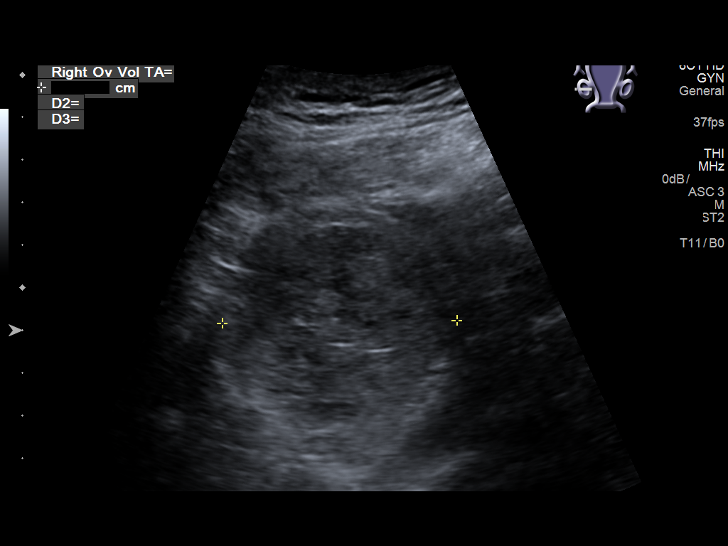
[im 30/66]
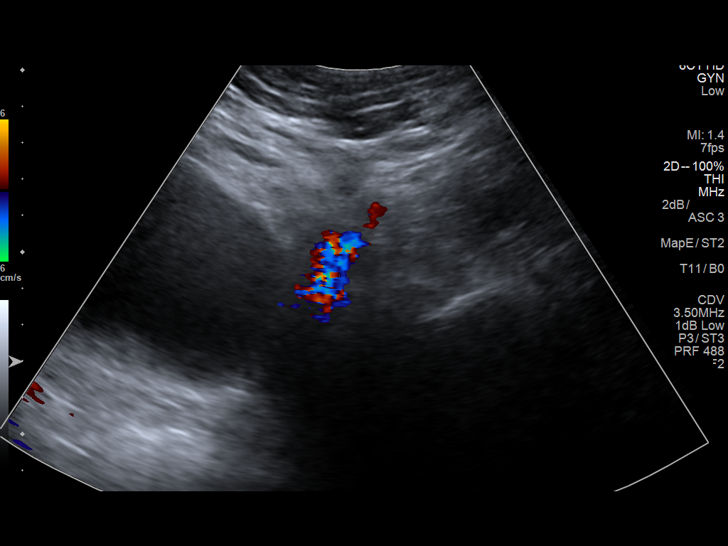
[im 36/66]
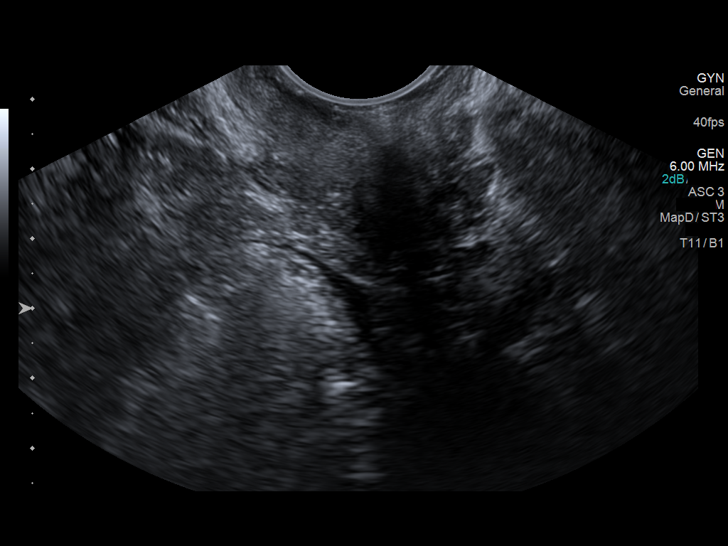
[im 41/66]
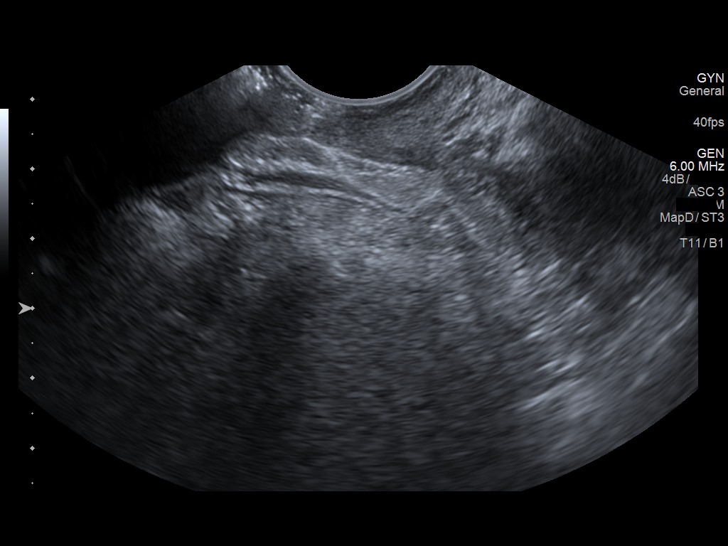
[im 44/66]
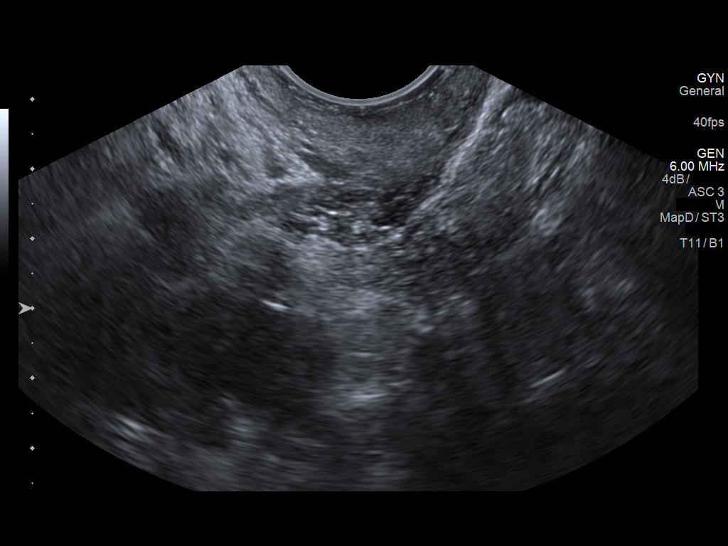
[im 49/66]
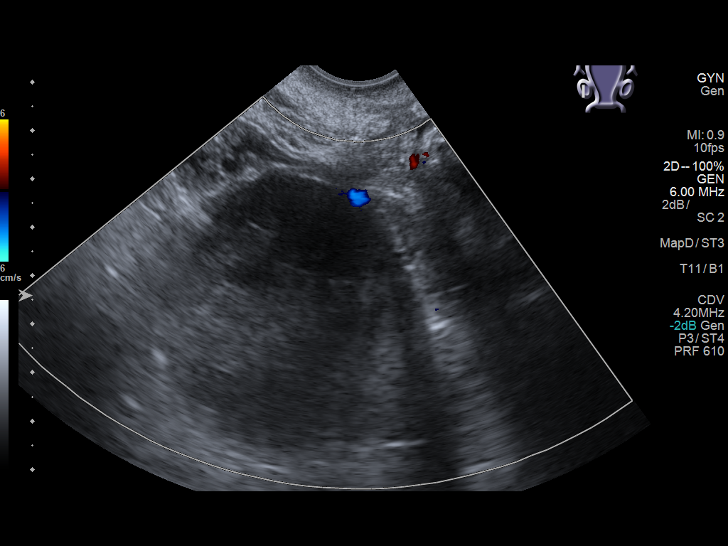
[im 55/66]
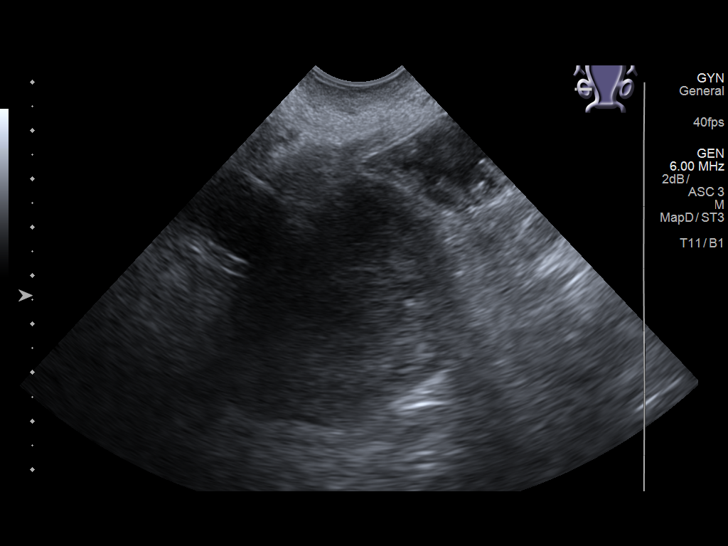
[im 60/66]
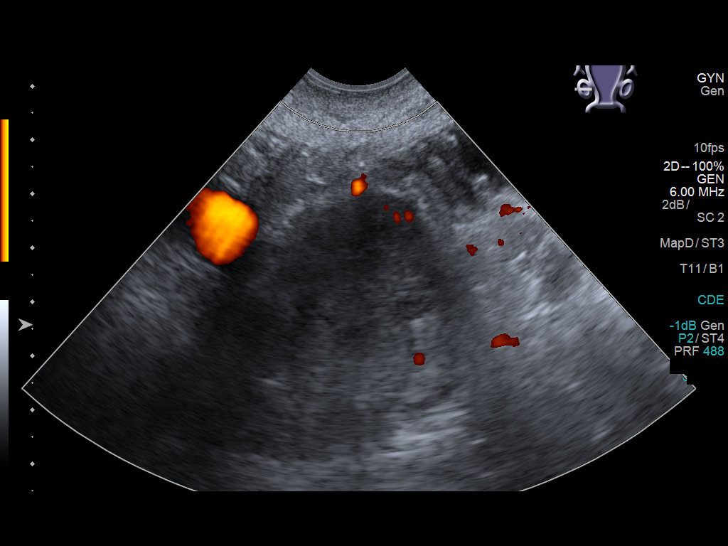
[im 66/66]
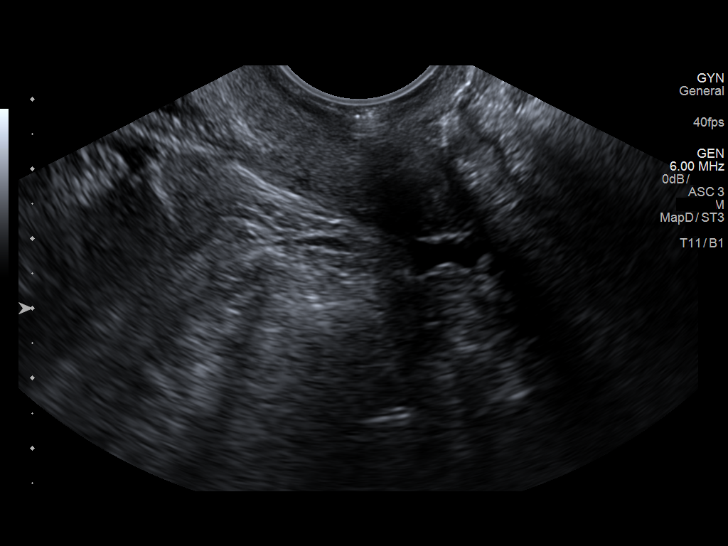

[14 of 25 positions shown; findings below may reference images not displayed]

FINDINGS: Uterus

Not visualized compatible with hysterectomy.

Right ovary

Measurements: 5.5 x 6 x 5.6 cm. A 5.8 x 5.4 x 4.6 cm hypoechoic
mass/cyst with posterior acoustic enhancement noted without internal
vascular flow.

Left ovary

Not visualized

Other findings

A trace amount of free fluid is noted.
IMPRESSION: 1. 5.8 cm probable cyst within the RIGHT ovary and likely benign.
Given imaging characteristics, this could represent an endometrioma.
Recommend pelvic ultrasound follow-up in 6-12 weeks.
2. LEFT ovary not visualized.
3. Status post hysterectomy.

## 2019-07-13 ENCOUNTER — Other Ambulatory Visit: Payer: Self-pay

## 2019-07-13 DIAGNOSIS — Z20822 Contact with and (suspected) exposure to covid-19: Secondary | ICD-10-CM

## 2019-07-15 LAB — NOVEL CORONAVIRUS, NAA: SARS-CoV-2, NAA: NOT DETECTED

## 2019-09-19 ENCOUNTER — Other Ambulatory Visit: Payer: Self-pay

## 2019-09-19 ENCOUNTER — Emergency Department
Admission: EM | Admit: 2019-09-19 | Discharge: 2019-09-19 | Disposition: A | Discharge status: Home / Self Care | Payer: Self-pay | Attending: Emergency Medicine | Admitting: Emergency Medicine

## 2019-09-19 ENCOUNTER — Emergency Department: Payer: Self-pay

## 2019-09-19 ENCOUNTER — Encounter: Payer: Self-pay | Admitting: Emergency Medicine

## 2019-09-19 DIAGNOSIS — F17210 Nicotine dependence, cigarettes, uncomplicated: Secondary | ICD-10-CM | POA: Insufficient documentation

## 2019-09-19 DIAGNOSIS — M25774 Osteophyte, right foot: Secondary | ICD-10-CM | POA: Insufficient documentation

## 2019-09-19 DIAGNOSIS — D573 Sickle-cell trait: Secondary | ICD-10-CM | POA: Insufficient documentation

## 2019-09-19 DIAGNOSIS — Z79899 Other long term (current) drug therapy: Secondary | ICD-10-CM | POA: Insufficient documentation

## 2019-09-19 DIAGNOSIS — M79671 Pain in right foot: Secondary | ICD-10-CM | POA: Insufficient documentation

## 2019-09-19 DIAGNOSIS — M779 Enthesopathy, unspecified: Secondary | ICD-10-CM

## 2019-09-19 DIAGNOSIS — I10 Essential (primary) hypertension: Secondary | ICD-10-CM | POA: Insufficient documentation

## 2019-09-19 MED ORDER — IBUPROFEN 600 MG PO TABS: 600.0000 mg | ORAL_TABLET | Freq: Four times a day (QID) | ORAL | 0 refills | Status: AC | PRN

## 2019-09-19 MED ORDER — KETOROLAC TROMETHAMINE 30 MG/ML IJ SOLN
30.0000 mg | Freq: Once | INTRAMUSCULAR | Status: AC
  Administered 2019-09-19: 30 mg via INTRAMUSCULAR
  Filled 2019-09-19: qty 1

## 2019-09-19 NOTE — ED Notes (Signed)
See triage note  Presents with pain to right heel   States pain started several days ago  Denies any injury  States she has tried new shoes and insoles

## 2019-09-19 NOTE — ED Provider Notes (Signed)
[facility] Emergency Department Provider Note  ____________________________________________  Time seen: Approximately 3:32 PM  I have reviewed the triage vital signs and the nursing notes.   HISTORY  Chief Complaint Foot Pain    HPI Dawn Alvarado is a 49 y.o. female that presents to the emergency department for evaluation of right heel pain for several days. She does not have any pain sitting still.  She only has pain when she places pressure to her foot.  Pain is only to the heel of her foot foot.  Patient states that she is on her feet all day at work.  Patient has tried different shoes and different inserts.  She has arthritis in her left knee.  No injury.  No swelling.   Past Medical History:  Diagnosis Date  . Anemia   . Arthritis   . Bipolar 1 disorder ([place])   . Bronchitis   . Chronic bronchitis ([place])   . Depression   . Hypertension   . Sickle cell trait [facility])     Patient Active Problem List   Diagnosis Date Noted  . Incisional hernia, without obstruction or gangrene   . Microcytosis 07/28/2018  . Family history of malignant neoplasm of gastrointestinal tract   . Polyp of sigmoid colon   . Internal hemorrhoids   . Benign neoplasm of ascending colon   . Anxiety 07/01/2018  . Bipolar 1 disorder ([place]) 07/01/2018  . Chronic bronchitis ([place]) 07/01/2018  . Depression 07/01/2018  . Sickle cell trait ([place]) 07/01/2018  . Abnormal uterine bleeding (AUB) 01/01/2017  . Smoking trying to quit 09/11/2016  . Ventral hernia without obstruction or gangrene 09/11/2016  . Primary osteoarthritis of left knee 11/27/2015  . Allergic rhinitis 11/15/2015  . Bilateral chronic knee pain 11/15/2015  . Essential hypertension 11/15/2015  . Menorrhagia with regular cycle 11/15/2015  . Subserous leiomyoma of uterus 11/15/2015  . History of hypokalemia 05/09/2014  . Body mass index (BMI) of 40.0-44.9 in adult ([place]) 05/09/2014  . Tobacco abuse 05/09/2014    Past  Surgical History:  Procedure Laterality Date  . ABDOMINAL HYSTERECTOMY    . APPENDECTOMY    . CESAREAN SECTION    . COLONOSCOPY WITH PROPOFOL N/A 07/14/2018   Procedure: COLONOSCOPY WITH PROPOFOL;  Surgeon: Virgel Manifold, MD;  Location: [facility] ENDOSCOPY;  Service: Endoscopy;  Laterality: N/A;  . UMBILICAL HERNIA REPAIR N/A 08/02/2018   Procedure: LAPAROSCOPIC  INTRA-UMBILICAL HERNIA WITH MESH;  Surgeon: Vickie Epley, MD;  Location: [facility] ORS;  Service: General;  Laterality: N/A;    Prior to Admission medications   Medication Sig Start Date End Date Taking? Authorizing Provider  amLODipine (NORVASC) 5 MG tablet Take 1 tablet (5 mg total) by mouth daily. 06/21/18   Malachy Mood, MD  hydrochlorothiazide (HYDRODIURIL) 25 MG tablet Take 1 tablet (25 mg total) by mouth daily. 06/21/18   Malachy Mood, MD  ibuprofen (ADVIL) 600 MG tablet Take 1 tablet (600 mg total) by mouth every 6 (six) hours as needed. 09/19/19   Laban Emperor, PA-C    Allergies Patient has no known allergies.  Family History  Problem Relation Age of Onset  . Cancer Father   . Prostate cancer Father   . Pancreatic cancer Father   . Cancer Maternal Aunt   . Cervical cancer Maternal Aunt     Social History Social History   Tobacco Use  . Smoking status: Current Every Day Smoker    Packs/day: 0.50    Types: Cigarettes  .  Smokeless tobacco: Never Used  Substance Use Topics  . Alcohol use: No  . Drug use: No     Review of Systems  Constitutional: No fever/chills Gastrointestinal: No nausea, no vomiting.  Musculoskeletal: Positive for heel pain. Skin: Negative for rash, abrasions, lacerations, ecchymosis. Neurological: Negative for headaches, numbness or tingling   ____________________________________________   PHYSICAL EXAM:  VITAL SIGNS: ED Triage Vitals  Enc Vitals Group     BP 09/19/19 1431 (!) 146/94     Pulse Rate 09/19/19 1431 83     Resp 09/19/19 1431 18     Temp 09/19/19  1431 98.1 F (36.7 C)     Temp Source 09/19/19 1431 Oral     SpO2 09/19/19 1431 100 %     Weight 09/19/19 1429 250 lb (113.4 kg)     Height 09/19/19 1429 5\' 11"  (1.803 m)     Head Circumference --      Peak Flow --      Pain Score 09/19/19 1429 9     Pain Loc --      Pain Edu? --      Excl. in [place]? --      Constitutional: Alert and oriented. Well appearing and in no acute distress. Eyes: Conjunctivae are normal. PERRL. EOMI. Head: Atraumatic. ENT:      Ears:      Nose: No congestion/rhinnorhea.      Mouth/Throat: Mucous membranes are moist.  Neck: No stridor. Cardiovascular: Normal rate, regular rhythm.  Good peripheral circulation. Respiratory: Normal respiratory effort without tachypnea or retractions. Lungs CTAB. Good air entry to the bases with no decreased or absent breath sounds. Musculoskeletal: Full range of motion to all extremities. No gross deformities appreciated.  Tenderness palpation to right heel.  No ankle swelling.  Full range of motion of ankle.  No forefoot pain. Neurologic:  Normal speech and language. No gross focal neurologic deficits are appreciated.  Skin:  Skin is warm, dry and intact. No rash noted. Psychiatric: Mood and affect are normal. Speech and behavior are normal. Patient exhibits appropriate insight and judgement.   ____________________________________________   LABS (all labs ordered are listed, but only abnormal results are displayed)  Labs Reviewed - No data to display ____________________________________________  EKG   ____________________________________________  RADIOLOGY Robinette Haines, personally viewed and evaluated these images (plain radiographs) as part of my medical decision making, as well as reviewing the written report by the radiologist.  DG Foot Complete Right  Result Date: 09/19/2019 CLINICAL DATA:  RIGHT heel pain worse with ambulation EXAM: RIGHT FOOT COMPLETE - 3+ VIEW COMPARISON:  None FINDINGS: Osseous  mineralization low normal. Degenerative changes of the RIGHT first MTP joint with joint space narrowing and spur formation. Hallux valgus with bony overgrowth and cystic changes of the first metatarsal head consistent with bunion deformity. No acute fracture, dislocation, or bone destruction. Small plantar calcaneal spur. IMPRESSION: Degenerative changes RIGHT first MTP joint with bunion deformity and hallux valgus. Calcaneal spurring. No acute abnormalities. Electronically Signed   By: Lavonia Dana M.D.   On: 09/19/2019 15:18    ____________________________________________    PROCEDURES  Procedure(s) performed:    Procedures    Medications  ketorolac (TORADOL) 30 MG/ML injection 30 mg (30 mg Intramuscular Given 09/19/19 1558)     ____________________________________________   INITIAL IMPRESSION / ASSESSMENT AND PLAN / ED COURSE  Pertinent labs & imaging results that were available during my care of the patient were reviewed by me and considered in my  medical decision making (see chart for details).  Review of the [place] CSRS was performed in accordance of the [place] prior to dispensing any controlled drugs.     Presented to emergency department for evaluation of heel pain.  Vital signs and exam are reassuring.  Exam is largely unremarkable.  X-ray consistent with bone spur and bunion.  Patient is already aware of the bunion.  Patient was given IM Toradol for pain.  Foot was Ace wrapped.  Patient will continue ibuprofen.  Patient is to follow up with podiatry as directed. Patient is given ED precautions to return to the ED for any worsening or new symptoms.  Dawn Alvarado was evaluated in Emergency Department on 09/19/2019 for the symptoms described in the history of present illness. She was evaluated in the context of the global COVID-19 pandemic, which necessitated consideration that the patient might be at risk for infection with the SARS-CoV-2 virus that causes COVID-19. Institutional  protocols and algorithms that pertain to the evaluation of patients at risk for COVID-19 are in a state of rapid change based on information released by regulatory bodies including the CDC and federal and state organizations. These policies and algorithms were followed during the patient's care in the ED.   ____________________________________________  FINAL CLINICAL IMPRESSION(S) / ED DIAGNOSES  Final diagnoses:  Pain of right heel  Bone spur      NEW MEDICATIONS STARTED DURING THIS VISIT:  ED Discharge Orders         Ordered    ibuprofen (ADVIL) 600 MG tablet  Every 6 hours PRN     09/19/19 1602              This chart was dictated using voice recognition software/Dragon. Despite best efforts to proofread, errors can occur which can change the meaning. Any change was purely unintentional.    Laban Emperor, PA-C 09/19/19 1742    Vanessa Maalaea, MD 09/20/19 [phone]

## 2019-09-19 NOTE — ED Triage Notes (Signed)
Pt here for right heel pain. Has switched shoes and tried gel insert without relief. Worse with ambulation.  A pulling/sharp feeling. Present X 1 month. Works on [employer].

## 2020-07-05 IMAGING — CT CT ABD-PELV W/ CM
2 of 5 series · 16 of 46 positions shown, 18 images · IV contrast (APPLIED)
Comparison: None.

CLINICAL DATA: Abdominal pain

EXAM:
CT ABDOMEN AND PELVIS WITH CONTRAST
TECHNIQUE: Multidetector CT imaging of the abdomen and pelvis was performed
using the standard protocol following bolus administration of
intravenous contrast.
CONTRAST:  125mL X6NI7Z-PRR IOPAMIDOL (X6NI7Z-PRR) INJECTION 61%

[Series 2: axial st · axial · 0.98mm/px · z∈[-501,-66]mm · 13 of 99 slices shown, 15 images]
[im 6/99  soft-tissue]
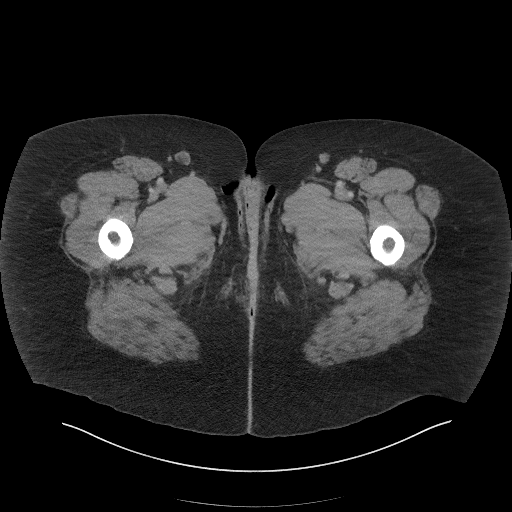
[im 6/99  bone]
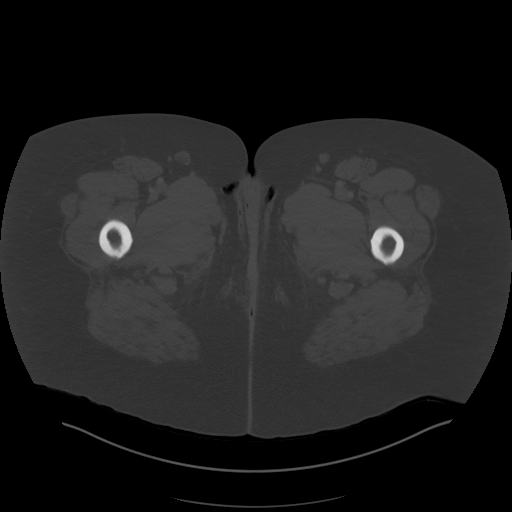
[im 11/99  soft-tissue]
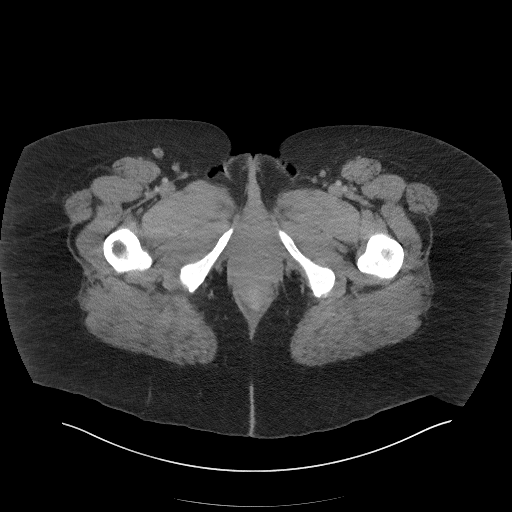
[im 22/99  soft-tissue]
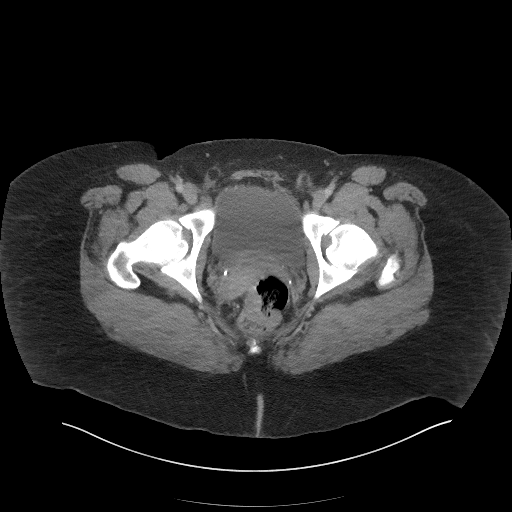
[im 28/99  soft-tissue]
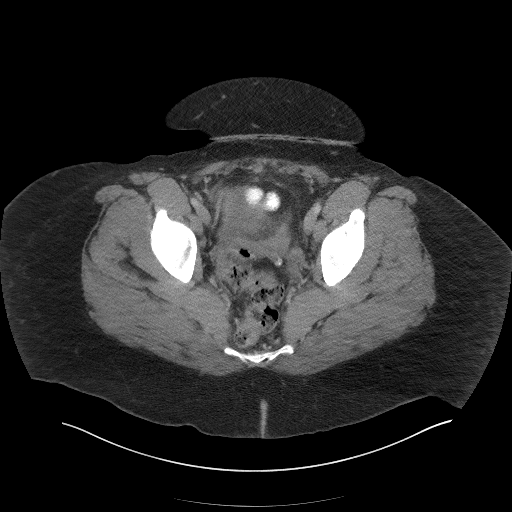
[im 33/99  soft-tissue]
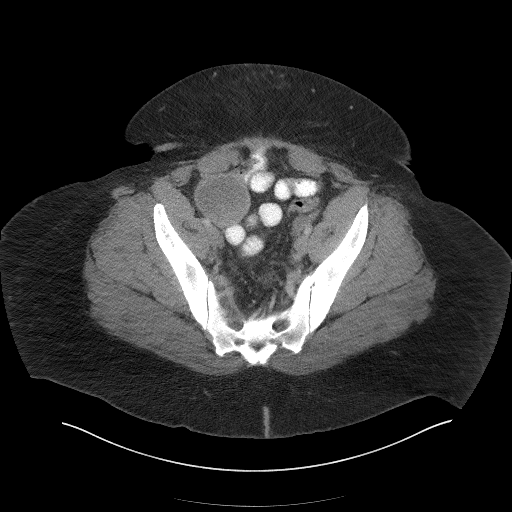
[im 44/99  soft-tissue]
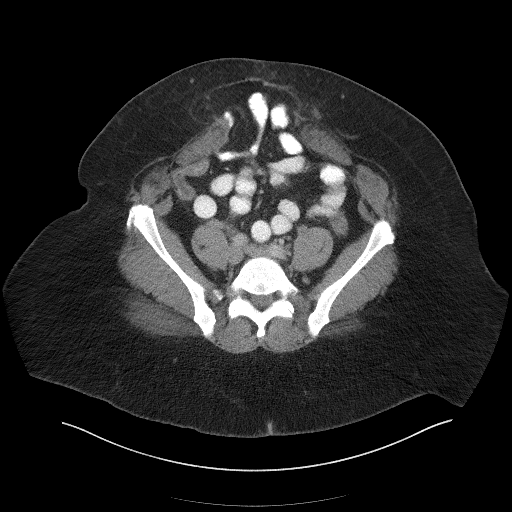
[im 50/99  soft-tissue]
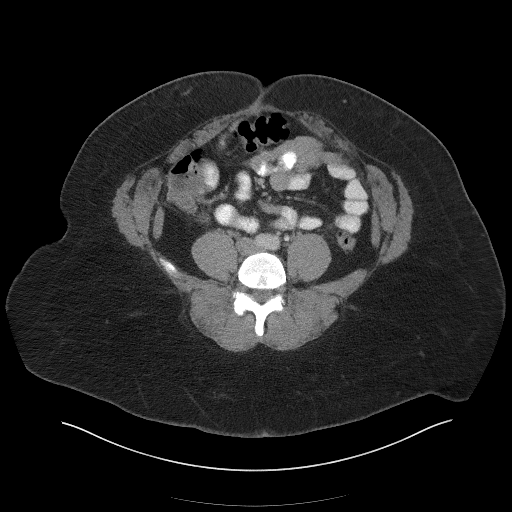
[im 55/99  soft-tissue]
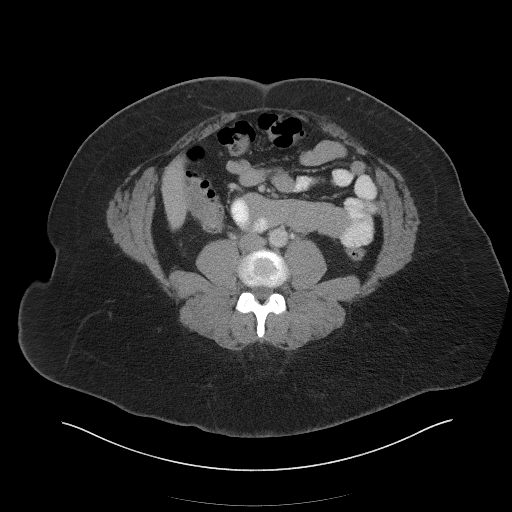
[im 66/99  soft-tissue]
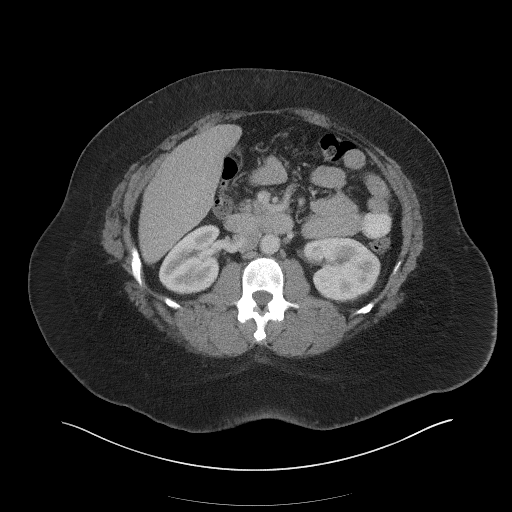
[im 66/99  bone]
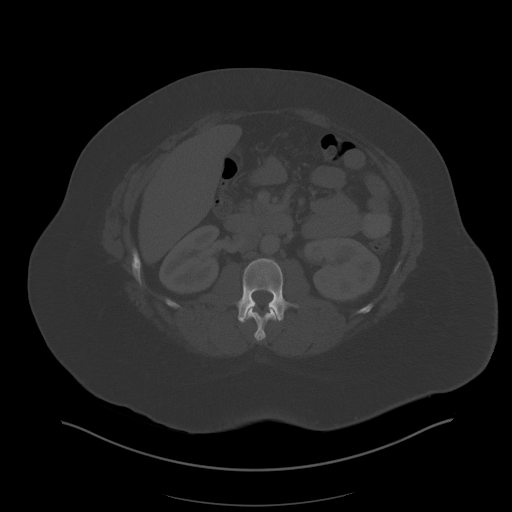
[im 71/99  soft-tissue]
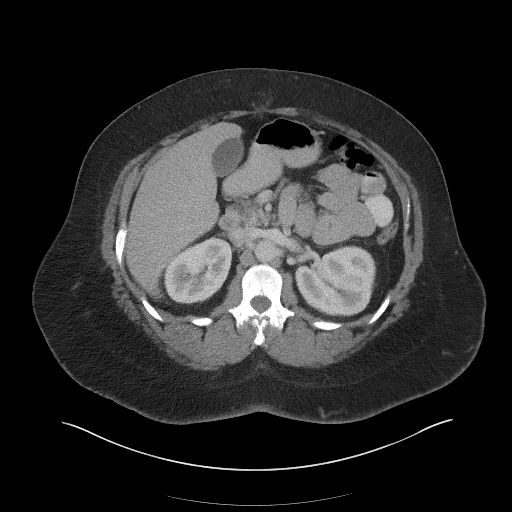
[im 77/99  soft-tissue]
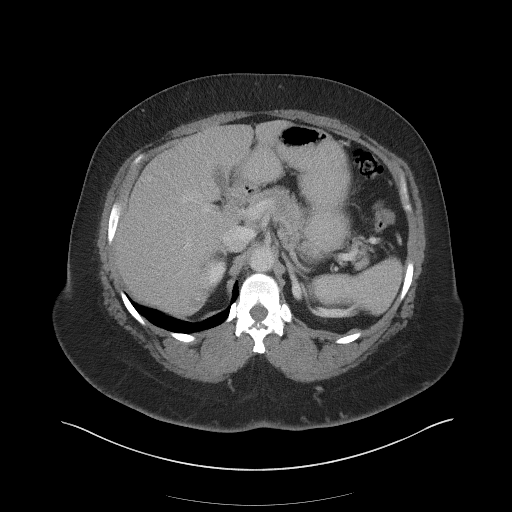
[im 88/99  soft-tissue]
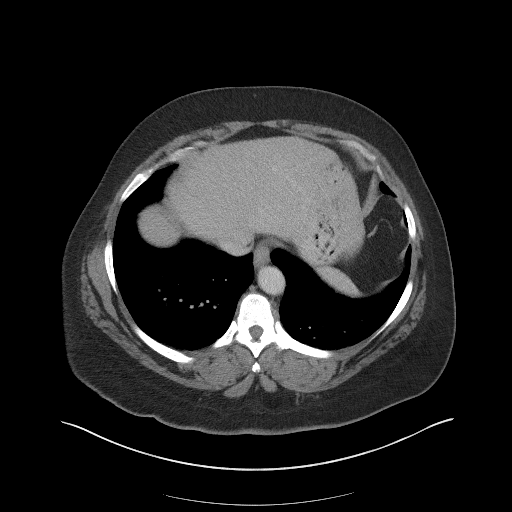
[im 93/99  soft-tissue]
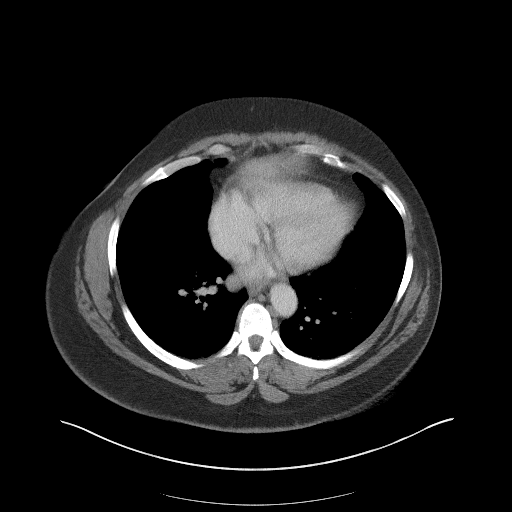

[Series 5: coronal st · coronal · 0.89mm/px · 3 of 113 slices shown]
[im 38/113  soft-tissue]
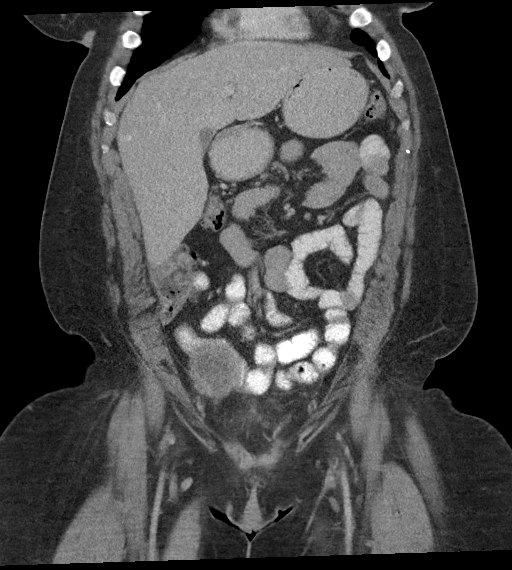
[im 50/113  soft-tissue]
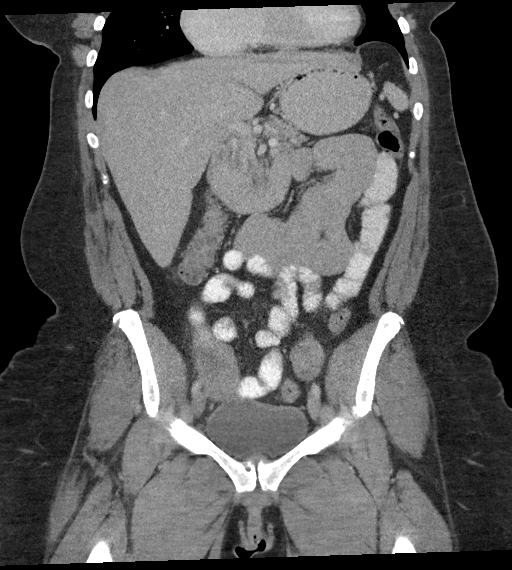
[im 63/113  soft-tissue]
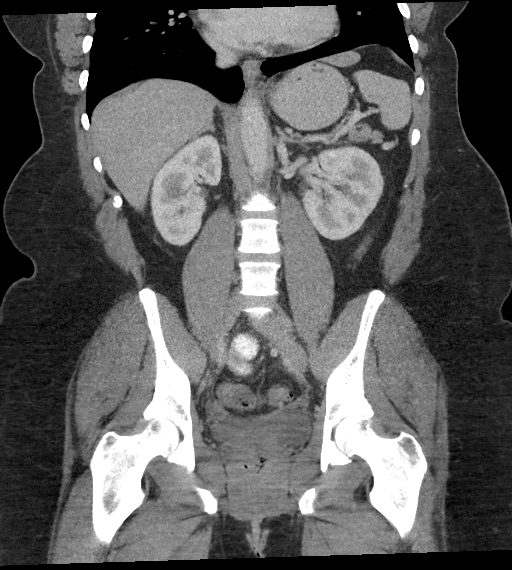

[16 of 46 positions shown; findings below may reference images not displayed]

FINDINGS: Lower chest: Lung bases demonstrate no acute consolidation or
pleural effusion. The heart size is upper normal.

Hepatobiliary: No focal liver abnormality is seen. No gallstones,
gallbladder wall thickening, or biliary dilatation.

Pancreas: Unremarkable. No pancreatic ductal dilatation or
surrounding inflammatory changes.

Spleen: Normal in size without focal abnormality.

Adrenals/Urinary Tract: Adrenal glands are unremarkable. Kidneys are
normal, without renal calculi, focal lesion, or hydronephrosis.
Bladder is unremarkable.

Stomach/Bowel: Stomach is nonenlarged. No dilated small bowel.
Status post appendectomy. Sigmoid colon diverticular disease without
convincing evidence for acute inflammation.

Vascular/Lymphatic: Mild aortic atherosclerosis. No aneurysm. No
significantly enlarged lymph nodes.

Reproductive: Status post hysterectomy. Thick-walled hypodense mass
in the right adnexa measuring 5.3 x 5.6 by 6.4 cm. No substantial
surrounding inflammatory changes.

Other: Negative for free air or significant free fluid. Moderate
infraumbilical ventral hernia containing fat and small bowel but no
evidence for obstruction or incarceration.

Musculoskeletal: No acute or suspicious osseous lesion
IMPRESSION: 1. Sigmoid colon diverticular disease without definitive acute
inflammatory process.
2. Slightly thick-walled appearing complex cyst or cystic mass in
the right adnexa measuring up to 6.4 cm. Pelvic ultrasound
evaluation is advised.
3. Infraumbilical ventral hernia containing fat and small bowel but
negative for obstruction or incarceration.

## 2021-05-21 DIAGNOSIS — M79671 Pain in right foot: Secondary | ICD-10-CM | POA: Diagnosis not present

## 2021-05-21 DIAGNOSIS — I10 Essential (primary) hypertension: Secondary | ICD-10-CM | POA: Diagnosis not present

## 2021-05-21 DIAGNOSIS — M7989 Other specified soft tissue disorders: Secondary | ICD-10-CM | POA: Diagnosis not present

## 2021-05-21 DIAGNOSIS — F319 Bipolar disorder, unspecified: Secondary | ICD-10-CM | POA: Diagnosis not present

## 2021-05-21 DIAGNOSIS — M2011 Hallux valgus (acquired), right foot: Secondary | ICD-10-CM | POA: Diagnosis not present

## 2021-05-21 DIAGNOSIS — Z6841 Body Mass Index (BMI) 40.0 and over, adult: Secondary | ICD-10-CM | POA: Diagnosis not present

## 2021-05-21 DIAGNOSIS — M21611 Bunion of right foot: Secondary | ICD-10-CM | POA: Diagnosis not present

## 2021-05-21 DIAGNOSIS — J449 Chronic obstructive pulmonary disease, unspecified: Secondary | ICD-10-CM | POA: Diagnosis not present

## 2021-05-21 DIAGNOSIS — F419 Anxiety disorder, unspecified: Secondary | ICD-10-CM | POA: Diagnosis not present

## 2021-05-21 DIAGNOSIS — M19071 Primary osteoarthritis, right ankle and foot: Secondary | ICD-10-CM | POA: Diagnosis not present

## 2021-05-21 DIAGNOSIS — Z79899 Other long term (current) drug therapy: Secondary | ICD-10-CM | POA: Diagnosis not present

## 2021-05-21 DIAGNOSIS — D573 Sickle-cell trait: Secondary | ICD-10-CM | POA: Diagnosis not present

## 2022-01-04 ENCOUNTER — Emergency Department: Payer: BLUE CROSS/BLUE SHIELD

## 2022-01-04 ENCOUNTER — Other Ambulatory Visit: Payer: Self-pay

## 2022-01-04 ENCOUNTER — Emergency Department
Admission: EM | Admit: 2022-01-04 | Discharge: 2022-01-04 | Disposition: A | Discharge status: Home / Self Care | Payer: BLUE CROSS/BLUE SHIELD | Attending: Emergency Medicine | Admitting: Emergency Medicine

## 2022-01-04 DIAGNOSIS — X500XXA Overexertion from strenuous movement or load, initial encounter: Secondary | ICD-10-CM | POA: Diagnosis not present

## 2022-01-04 DIAGNOSIS — I10 Essential (primary) hypertension: Secondary | ICD-10-CM | POA: Insufficient documentation

## 2022-01-04 DIAGNOSIS — F17200 Nicotine dependence, unspecified, uncomplicated: Secondary | ICD-10-CM | POA: Diagnosis not present

## 2022-01-04 DIAGNOSIS — Y990 Civilian activity done for income or pay: Secondary | ICD-10-CM | POA: Diagnosis not present

## 2022-01-04 DIAGNOSIS — R200 Anesthesia of skin: Secondary | ICD-10-CM | POA: Diagnosis not present

## 2022-01-04 DIAGNOSIS — R079 Chest pain, unspecified: Secondary | ICD-10-CM | POA: Diagnosis present

## 2022-01-04 DIAGNOSIS — E876 Hypokalemia: Secondary | ICD-10-CM | POA: Insufficient documentation

## 2022-01-04 LAB — BASIC METABOLIC PANEL
Anion gap: 6 (ref 5–15)
BUN: 7 mg/dL (ref 6–20)
CO2: 28 mmol/L (ref 22–32)
Calcium: 9.6 mg/dL (ref 8.9–10.3)
Chloride: 101 mmol/L (ref 98–111)
Creatinine, Ser: 0.58 mg/dL (ref 0.44–1.00)
GFR, Estimated: >60 mL/min (ref >60)
Glucose, Bld: 80 mg/dL (ref 70–99)
Potassium: 3 mmol/L — ABNORMAL LOW (ref 3.5–5.1)
Sodium: 135 mmol/L (ref 135–145)

## 2022-01-04 LAB — CBC
HCT: 41.6 % (ref 36.0–46.0)
Hemoglobin: 15 g/dL (ref 12.0–15.0)
MCH: 28.1 pg (ref 26.0–34.0)
MCHC: 36.1 g/dL — ABNORMAL HIGH (ref 30.0–36.0)
MCV: 78 fL — ABNORMAL LOW (ref 80.0–100.0)
Platelets: 189 10*3/uL (ref 150–400)
RBC: 5.33 MIL/uL — ABNORMAL HIGH (ref 3.87–5.11)
RDW: 13.9 % (ref 11.5–15.5)
WBC: 7 10*3/uL (ref 4.0–10.5)
nRBC: 0 % (ref 0.0–0.2)

## 2022-01-04 LAB — TROPONIN I (HIGH SENSITIVITY)
Troponin I (High Sensitivity): 3 ng/L (ref <18)
Troponin I (High Sensitivity): 4 ng/L (ref <18)

## 2022-01-04 MED ORDER — POTASSIUM CHLORIDE CRYS ER 20 MEQ PO TBCR: 20.0000 meq | EXTENDED_RELEASE_TABLET | Freq: Two times a day (BID) | ORAL | 0 refills | Status: AC

## 2022-01-04 MED ORDER — POTASSIUM CHLORIDE CRYS ER 20 MEQ PO TBCR
40.0000 meq | EXTENDED_RELEASE_TABLET | Freq: Once | ORAL | Status: AC
  Administered 2022-01-04: 40 meq via ORAL
  Filled 2022-01-04: qty 2

## 2022-01-04 MED ORDER — OXYCODONE-ACETAMINOPHEN 5-325 MG PO TABS
1.0000 | ORAL_TABLET | Freq: Once | ORAL | Status: AC
  Administered 2022-01-04: 1 via ORAL
  Filled 2022-01-04: qty 1

## 2022-01-04 NOTE — ED Triage Notes (Signed)
BIB [facility] from work at [employer]. Pt able to stand and sit on hospital bed from EMS stretcher with no assistance. CC of CP, numbness and tingling in left arm that started about a week ago. Pain radiates to the back. Pt does do heavy lifting at work.  Pt was supposed to have carpal tunnel surgery on monday but' \\it'$  was cancelled.  153/112 100% HR 90

## 2022-01-04 NOTE — ED Provider Notes (Signed)
[facility] Provider Note    Event Date/Time   First MD Initiated Contact with Patient 01/04/22 [phone]     (approximate)   History   Chest Pain   HPI  Dawn Alvarado is a 51 y.o. female past medical history of sickle cell trait, bipolar disorder, hypertension who presents with chest pain and arm numbness.  Patient notes that she has carpal tunnel syndrome bilaterally and has chronic numbness in her bilateral wrists that radiates to her arms.  Been worse over the last week and seems to be radiating to the left chest.  She also has pinched nerve in her neck which causes pain to travel down the neck to her left arm.  Denies shortness of breath dyspnea nausea or diaphoresis.  While she was at work today she was lifting cans of oil and the pain in her chest was worse.  Is been coming and going is nonexertional nonpleuritic.  She has no history of heart attacks or blood clots.  No lower extremity edema.    Past Medical History:  Diagnosis Date   Anemia    Arthritis    Bipolar 1 disorder ([place])    Bronchitis    Chronic bronchitis ([facility])    Depression    Hypertension    Sickle cell trait [facility])     Patient Active Problem List   Diagnosis Date Noted   Incisional hernia, without obstruction or gangrene    Microcytosis 07/28/2018   Family history of malignant neoplasm of gastrointestinal tract    Polyp of sigmoid colon    Internal hemorrhoids    Benign neoplasm of ascending colon    Anxiety 07/01/2018   Bipolar 1 disorder ([place]) 07/01/2018   Chronic bronchitis ([place]) 07/01/2018   Depression 07/01/2018   Sickle cell trait ([place]) 07/01/2018   Abnormal uterine bleeding (AUB) 01/01/2017   Smoking trying to quit 09/11/2016   Ventral hernia without obstruction or gangrene 09/11/2016   Primary osteoarthritis of left knee 11/27/2015   Allergic rhinitis 11/15/2015   Bilateral chronic knee pain 11/15/2015   Essential hypertension 11/15/2015   Menorrhagia with regular  cycle 11/15/2015   Subserous leiomyoma of uterus 11/15/2015   History of hypokalemia 05/09/2014   Body mass index (BMI) of 40.0-44.9 in adult [facility]) 05/09/2014   Tobacco abuse 05/09/2014     Physical Exam  Triage Vital Signs: ED Triage Vitals  Enc Vitals Group     BP 01/04/22 0721 (!) 152/122     Pulse Rate 01/04/22 0721 75     Resp 01/04/22 0721 18     Temp 01/04/22 0721 98.1 F (36.7 C)     Temp Source 01/04/22 0721 Oral     SpO2 01/04/22 0721 98 %     Weight 01/04/22 0722 209 lb (94.8 kg)     Height 01/04/22 0722 '5\' 10"'$  (1.778 m)     Head Circumference --      Peak Flow --      Pain Score 01/04/22 0721 7     Pain Loc --      Pain Edu? --      Excl. in [place]? --     Most recent vital signs: Vitals:   01/04/22 0721 01/04/22 0940  BP: (!) 152/122 (!) 150/100  Pulse: 75 80  Resp: 18 16  Temp: 98.1 F (36.7 C)   SpO2: 98% 98%     General: Awake, no distress.  CV:  Good peripheral perfusion.  No peripheral edema Resp:  Normal effort.  Lungs are clear Abd:  No distention.  Abdomen is soft nontender throughout Neuro:             Awake, Alert, Oriented x 3  Other:  Subjective decrease sensation in left index finger compared to right 5 out of 5 strength with elbow flexion, extension, grip, finger abduction, thumbs up and okay sign   ED Results / Procedures / Treatments  Labs (all labs ordered are listed, but only abnormal results are displayed) Labs Reviewed  BASIC METABOLIC PANEL - Abnormal; Notable for the following components:      Result Value   Potassium 3.0 (*)    All other components within normal limits  CBC - Abnormal; Notable for the following components:   RBC 5.33 (*)    MCV 78.0 (*)    MCHC 36.1 (*)    All other components within normal limits  POC URINE PREG, ED  TROPONIN I (HIGH SENSITIVITY)  TROPONIN I (HIGH SENSITIVITY)     EKG  Interpreted myself shows normal sinus rhythm normal axis normal intervals borderline biphasic T waves in V2 and  V3, ischemic changes   RADIOLOGY I reviewed and interpreted the CXR which does not show any acute cardiopulmonary process    PROCEDURES:  Critical Care performed: No  .1-3 Lead EKG Interpretation Performed by: Rada Hay, MD Authorized by: Rada Hay, MD     Interpretation: normal     ECG rate assessment: normal     Ectopy: none     Conduction: normal    The patient is on the cardiac monitor to evaluate for evidence of arrhythmia and/or significant heart rate changes.   MEDICATIONS ORDERED IN ED: Medications  potassium chloride SA (KLOR-CON M) CR tablet 40 mEq (40 mEq Oral Given 01/04/22 0929)  oxyCODONE-acetaminophen (PERCOCET/ROXICET) 5-325 MG per tablet 1 tablet (1 tablet Oral Given 01/04/22 0929)     IMPRESSION / MDM / ASSESSMENT AND PLAN / ED COURSE  I reviewed the triage vital signs and the nursing notes.                              Differential diagnosis includes, but is not limited to, ACS, musculoskeletal, cervical radiculopathy/radiation of pain from carpal tunnel syndrome, less likely PE aortic dissection  Is a 51 year old female who presents with both chest pain and arm numbness.  She has known carpal tunnel syndrome and this arm numbness weakness is not new but the radiation of symptoms to the chest is over the last week.  It is intermittent nonpleuritic nonexertional and was worse today when she was lifting heavy objects at work.  She is hypertensive but otherwise her vitals are within normal limits she appears well.  She does have subjective decrease sensation in distribution of the median nerve on the left but strength otherwise is normal from C5-T1 distribution.  Lungs are clear abdomen is benign and there is no lower extremity edema.  I reviewed her EKG which shows some possible biphasic T waves in V2 and V3 but otherwise no overt ischemic changes.  Troponin is negative.  My suspicion for ACS overall is low I suspect that this is more  musculoskeletal potentially related to the cervical radiculopathy.  We will treat with Percocet.  She is mildly hypokalemic with a potassium of 3 which will supplement orally.  Troponins x2 are negative.  Overall reassured with her atypical symptoms likely to be musculoskeletal.  Discharged  with 5 days of potassium and PCP follow-up.   FINAL CLINICAL IMPRESSION(S) / ED DIAGNOSES   Final diagnoses:  Hypokalemia  Chest pain, unspecified type     Rx / DC Orders   ED Discharge Orders          Ordered    potassium chloride SA (KLOR-CON M) 20 MEQ tablet  2 times daily        01/04/22 1013             Note:  This document was prepared using Dragon voice recognition software and may include unintentional dictation errors.   Rada Hay, MD 01/04/22 1013

## 2022-01-04 NOTE — Discharge Instructions (Signed)
Your cardiac work-up was all reassuring today.  Please follow-up with your primary care provider regarding your chest pain and carpal tunnel syndrome.  Potassium was somewhat low today.  Please take the potassium supplement for the next 5 days and follow-up with your primary care provider to have a recheck in about 1 week.

## 2023-04-28 ENCOUNTER — Ambulatory Visit: Payer: 59 | Admitting: Podiatry

## 2023-05-07 ENCOUNTER — Ambulatory Visit: Payer: 59 | Admitting: Podiatry

## 2024-01-26 IMAGING — CR DG CHEST 2V
1 series · 2 of 2 positions shown · non-contrast
Comparison: 02/08/2019

CLINICAL DATA: Chest pain with numbness and tingling in left arm.

EXAM:
CHEST - 2 VIEW

[Series 1: dg chest 2 view · 0.14mm/px · 2 of 2 slices shown]
[im 1/2]
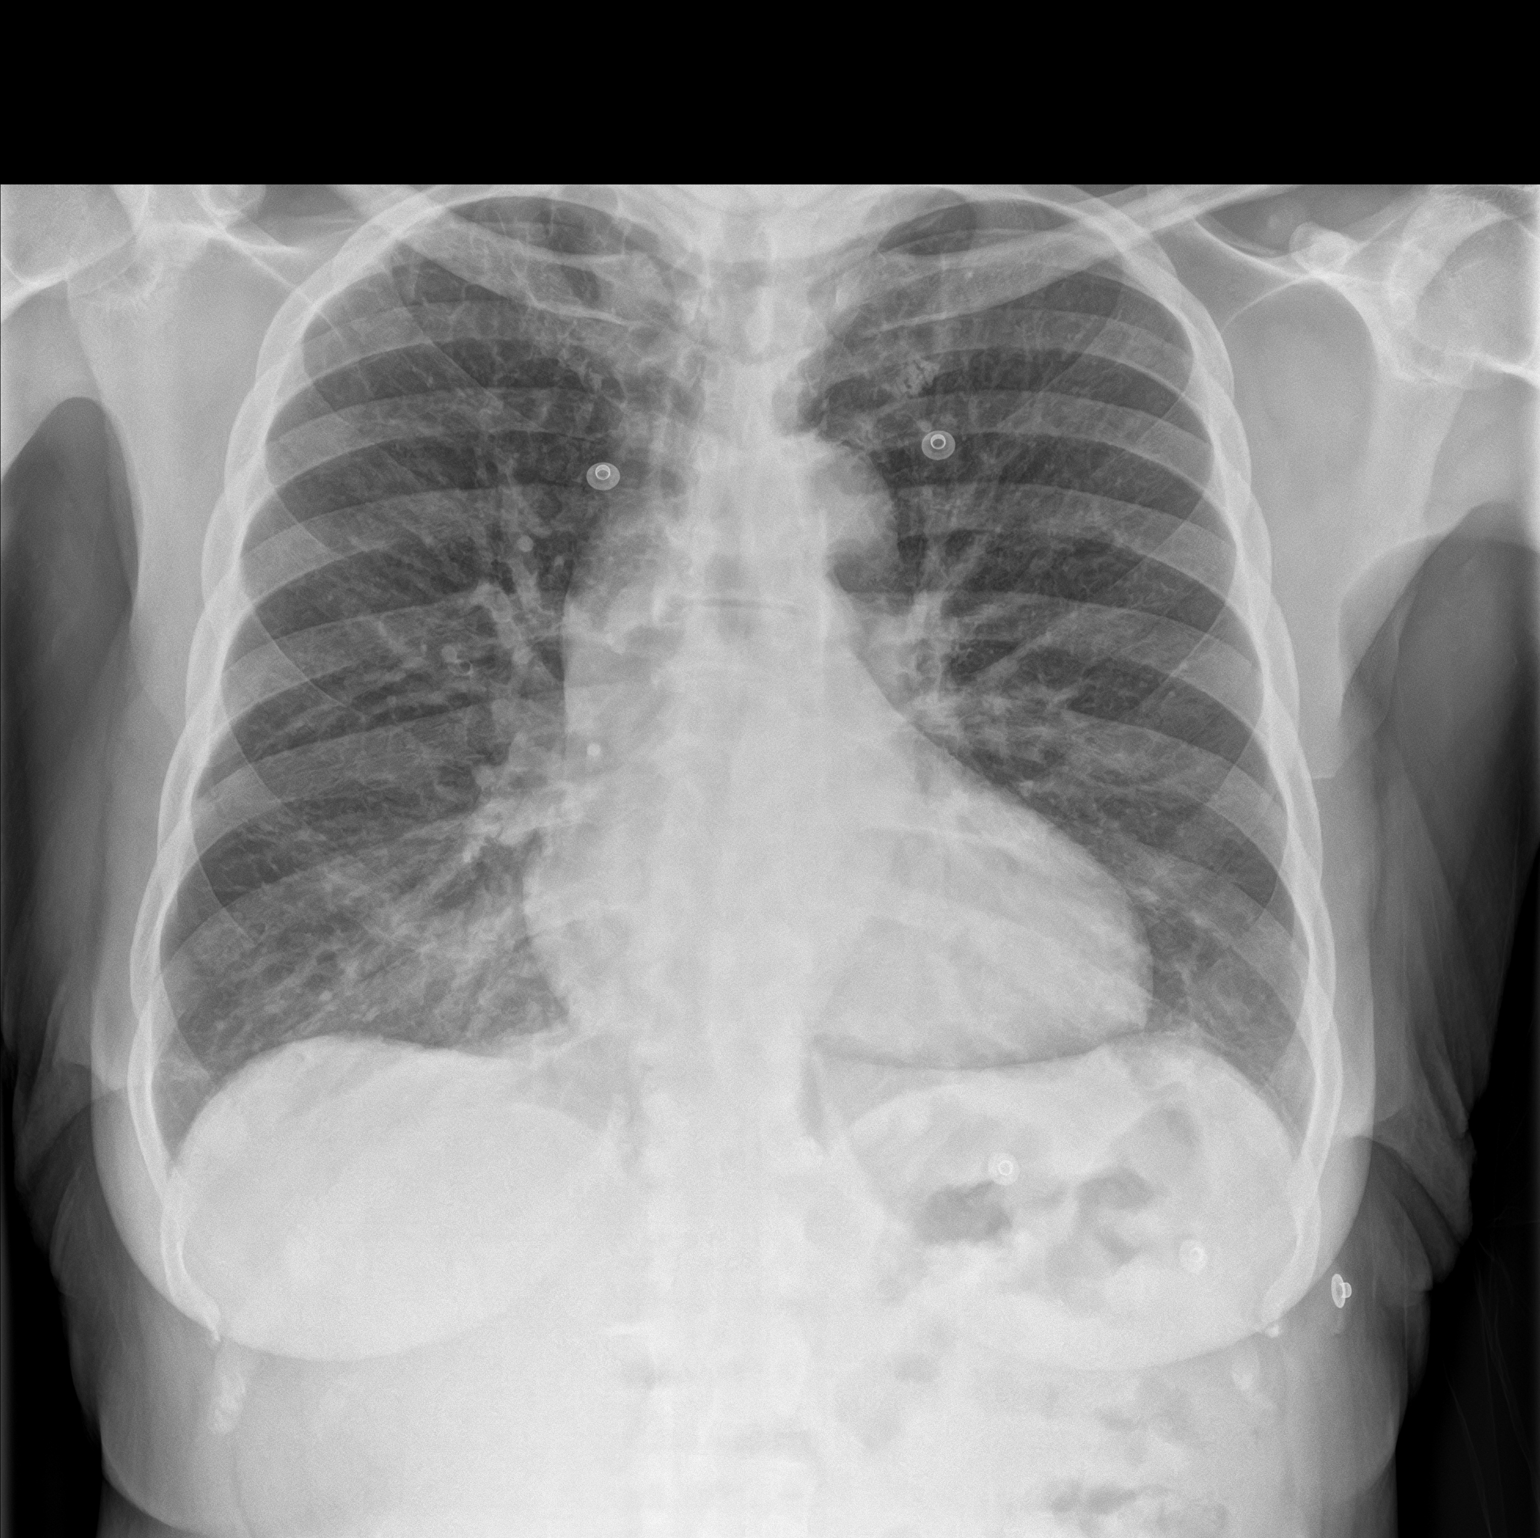
[im 2/2]
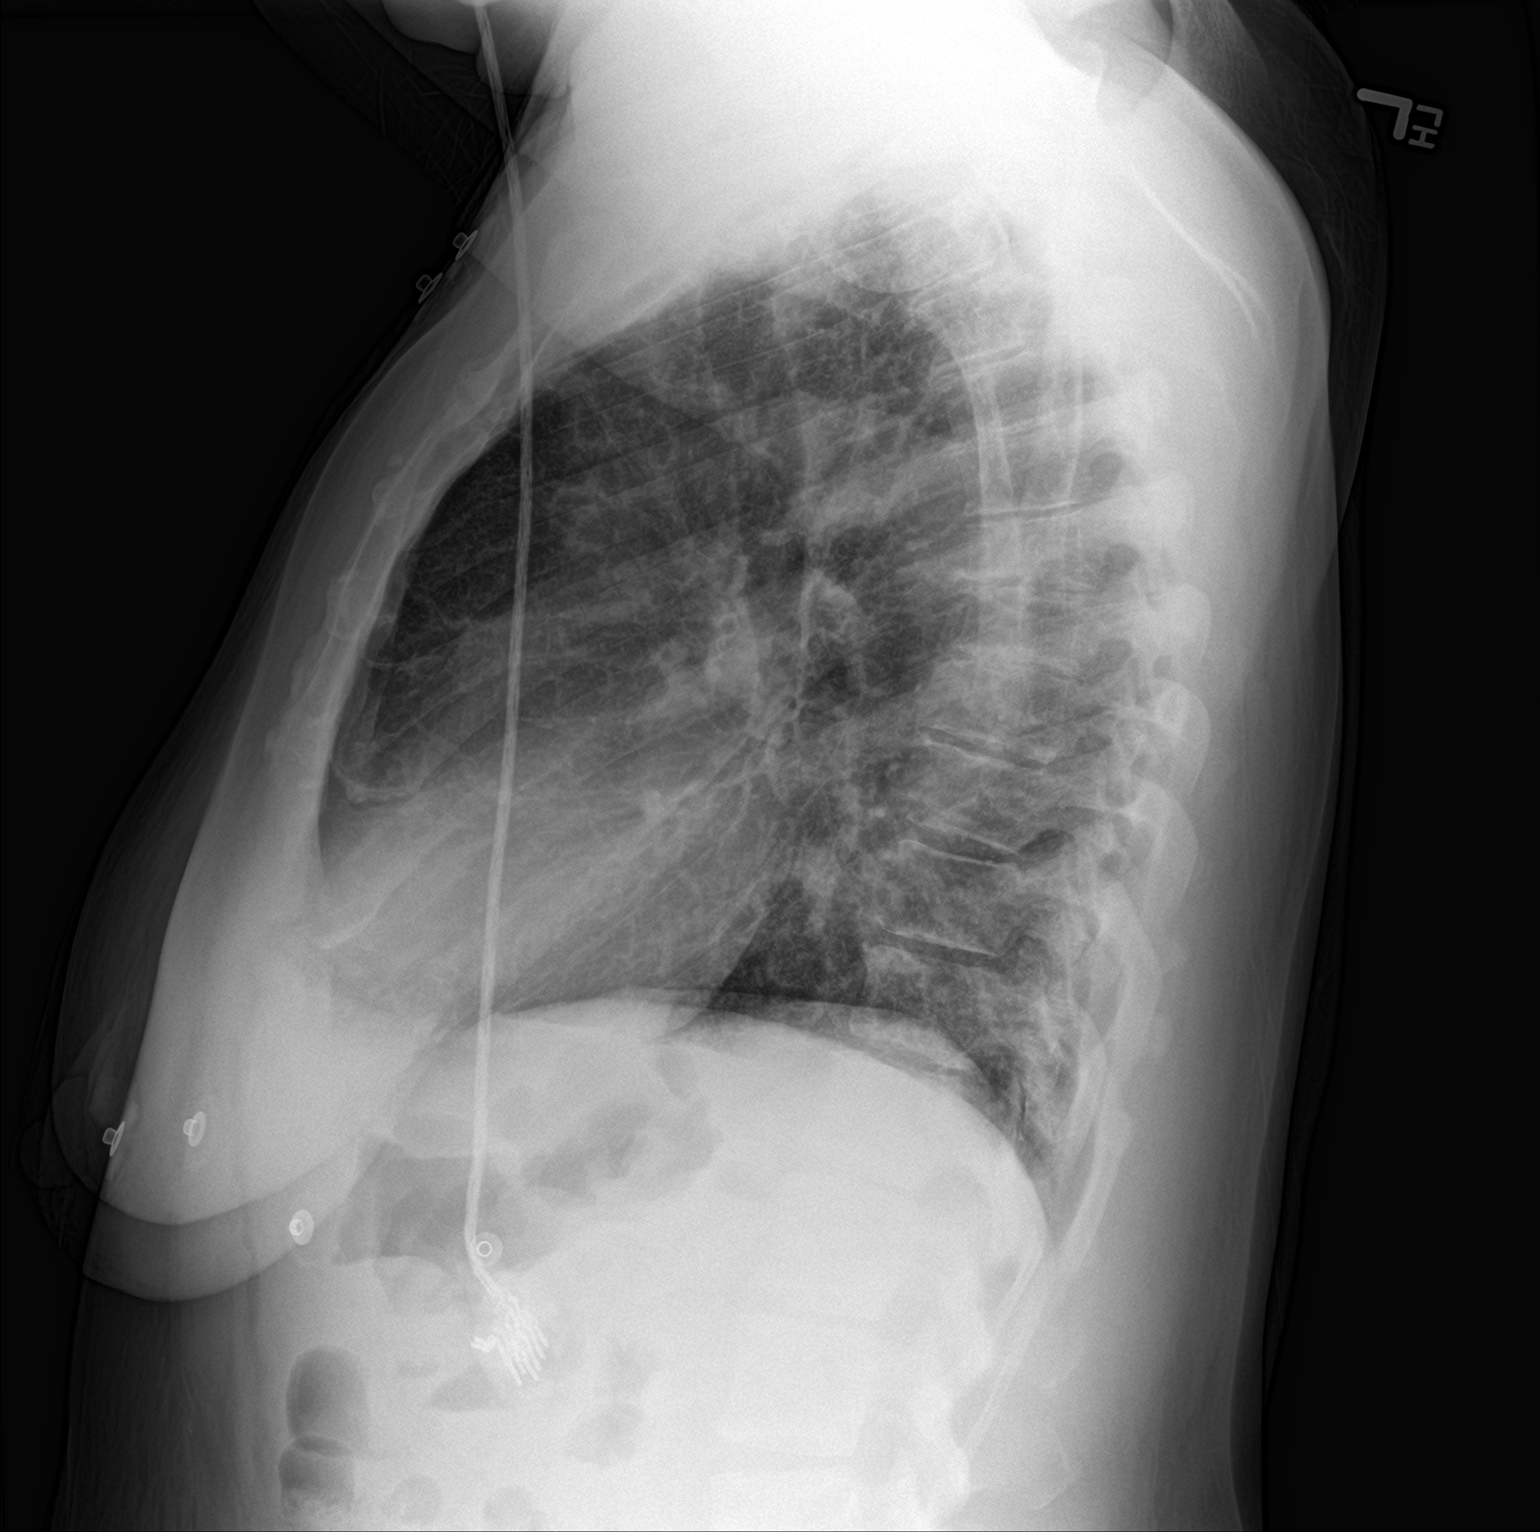

[2 of 2 positions shown; findings below may reference images not displayed]

FINDINGS: The lungs are clear without focal pneumonia, edema, pneumothorax or
pleural effusion. The cardiopericardial silhouette is within normal
limits for size. The visualized bony structures of the thorax are
unremarkable.
IMPRESSION: No active cardiopulmonary disease.

## 2024-02-02 ENCOUNTER — Ambulatory Visit: Admitting: Podiatry

## 2024-02-02 DIAGNOSIS — D492 Neoplasm of unspecified behavior of bone, soft tissue, and skin: Secondary | ICD-10-CM

## 2024-02-02 NOTE — Progress Notes (Signed)
 Subjective:  Patient ID: Dawn Alvarado, female    DOB: Sep 14, 1970,  MRN: [id number]  Chief Complaint  Patient presents with   Callouses    53 y.o. female presents with the above complaint.  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  She is not a diabetic.   Review of Systems: Negative except as noted in the HPI. Denies N/V/F/Ch.  Past Medical History:  Diagnosis Date   Anemia    Arthritis    Bipolar 1 disorder (H[facility])    Bronchitis    Chronic bronchitis ([facility])    Depression    Hypertension    Sickle cell trait ([facility])     Current Outpatient Medications:    amLODipine  (NORVASC ) 5 MG tablet, Take 1 tablet (5 mg total) by mouth daily., Disp: 30 tablet, Rfl: 3   ARIPiprazole (ABILIFY) 10 MG tablet, Take 10 mg by mouth daily., Disp: , Rfl:    citalopram (CELEXA) 20 MG tablet, Take 20 mg by mouth every morning., Disp: , Rfl:    cyclobenzaprine  (FLEXERIL ) 10 MG tablet, Take 10 mg by mouth 3 (three) times daily., Disp: , Rfl:    diazepam (VALIUM) 5 MG tablet, Take 5 mg by mouth every 6 (six) hours as needed., Disp: , Rfl:    gabapentin  (NEURONTIN ) 600 MG tablet, Take by mouth. 2 TABLETS BY MOUTH IN THE MORNING, 1 TABLET MID-DAY AND 2 TABLETS AT BEDTIME, Disp: , Rfl:    hydrochlorothiazide  (HYDRODIURIL ) 25 MG tablet, Take 1 tablet (25 mg total) by mouth daily., Disp: 30 tablet, Rfl: 2   ibuprofen  (ADVIL ) 600 MG tablet, Take 1 tablet (600 mg total) by mouth every 6 (six) hours as needed., Disp: 30 tablet, Rfl: 0   meloxicam  (MOBIC ) 7.5 MG tablet, Take 7.5 mg by mouth daily., Disp: , Rfl:    oxyCODONE  (OXY IR/ROXICODONE ) 5 MG immediate release tablet, Take 5 mg by mouth every 6 (six) hours as needed., Disp: , Rfl:    potassium chloride  SA (KLOR-CON  M) 20 MEQ  tablet, Take 1 tablet (20 mEq total) by mouth 2 (two) times daily for 5 days., Disp: 10 tablet, Rfl: 0   tranexamic acid (LYSTEDA) 650 MG TABS tablet, Take 1,300 mg by mouth 2 (two) times daily., Disp: , Rfl:    traZODone (DESYREL) 50 MG tablet, Take 3 tablets by mouth at bedtime., Disp: , Rfl:   Social History   Tobacco Use  Smoking Status Every Day   Current packs/day: 0.50   Types: Cigarettes  Smokeless Tobacco Never    No Known Allergies Objective:  There were no vitals filed for this visit. There is no height or weight on file to calculate BMI. Constitutional Well developed. Well nourished.  Vascular Dorsalis pedis pulses palpable bilaterally. Posterior tibial pulses palpable bilaterally. Capillary refill normal to all digits.  No cyanosis or clubbing noted. Pedal hair growth normal.  Neurologic Normal speech. Oriented to person, place, and time. Epicritic sensation to light touch grossly present bilaterally.  Dermatologic Bilateral submet 1 skin neoplasm lesion with central nucleated with no core noted pain on palpation.  Orthopedic: Normal joint ROM without pain or crepitus bilaterally. No visible deformities. No bony tenderness.   Radiographs: None Assessment:  No diagnosis found. Plan:  Patient was evaluated and treated and all questions  answered.  Bilateral submet 1 neoplasm - All questions and concerns were discussed with the patient in extensive detail - Shoe gear modification discussed - Patient would benefit from debridement of the lesion as a courtesy the lesion was debrided down to healthy dry tissue no complication noted no pinpoint bleeding noted  No follow-ups on file.
